# Patient Record
Sex: Male | Born: 1985 | Race: White | Hispanic: No | Marital: Married | Smoking: Never smoker
Health system: Southern US, Community
[De-identification: ages and names within clinical notes are randomized; demographics above are authoritative.]

## PROBLEM LIST (undated history)

## (undated) DIAGNOSIS — R519 Headache, unspecified: Secondary | ICD-10-CM

## (undated) DIAGNOSIS — J45909 Unspecified asthma, uncomplicated: Secondary | ICD-10-CM

## (undated) HISTORY — PX: WISDOM TOOTH EXTRACTION: SHX21

## (undated) HISTORY — PX: IMAGE GUIDED SINUS SURGERY: SHX6570

---

## 2007-09-30 ENCOUNTER — Ambulatory Visit: Payer: Self-pay | Admitting: Internal Medicine

## 2017-01-14 ENCOUNTER — Encounter: Payer: Self-pay | Admitting: *Deleted

## 2017-01-14 ENCOUNTER — Ambulatory Visit
Admission: EM | Admit: 2017-01-14 | Discharge: 2017-01-14 | Disposition: A | Discharge status: Home / Self Care | Payer: BLUE CROSS/BLUE SHIELD | Attending: Family Medicine | Admitting: Family Medicine

## 2017-01-14 DIAGNOSIS — J020 Streptococcal pharyngitis: Secondary | ICD-10-CM

## 2017-01-14 HISTORY — DX: Unspecified asthma, uncomplicated: J45.909

## 2017-01-14 LAB — RAPID STREP SCREEN (MED CTR MEBANE ONLY): STREPTOCOCCUS, GROUP A SCREEN (DIRECT): POSITIVE — AB

## 2017-01-14 MED ORDER — PENICILLIN G BENZATHINE 1200000 UNIT/2ML IM SUSP
1.2000 10*6.[IU] | Freq: Once | INTRAMUSCULAR | Status: AC
  Administered 2017-01-14: 1.2 10*6.[IU] via INTRAMUSCULAR

## 2017-01-14 MED ORDER — DEXAMETHASONE SODIUM PHOSPHATE 10 MG/ML IJ SOLN
10.0000 mg | Freq: Once | INTRAMUSCULAR | Status: AC
  Administered 2017-01-14: 10 mg via INTRAMUSCULAR

## 2017-01-14 NOTE — ED Provider Notes (Signed)
[facility] ____________________________________________  Time seen: Approximately 7:30 PM  I have reviewed the triage vital signs and the nursing notes.   HISTORY  Chief Complaint Oral Swelling   HPI Neil Serrano is a 31 y.o. male  presenting with wife at bedside for evaluation of sore throat since yesterday. Patient reports this time sore throat is mild. states that most sore throat is moderate. States sore throat was worse this morning when he first got up. States has taken some over-the-counter Tylenol or ibuprofen with some improvement. States has not take any medication last few hours. Reports he has some runny nose and nasal congestion that is consistent with his chronic seasonal allergies. Denies cough. Reports continues to eat and drink well. Denies any difficulty swallowing or breathing. Denies known sick contacts. Denies recent antibiotic use. Denies chest pain, shortness of breath, abdominal pain, dysuria or rash. Patient states he is hungry now. Reports has continued to remain active. Denies aggravating or alleviating factors. Denies known fevers.    Past Medical History:  Diagnosis Date  . Asthma     There are no active problems to display for this patient.   Past Surgical History:  Procedure Laterality Date  . WISDOM TOOTH EXTRACTION       No current facility-administered medications for this encounter.  No current outpatient prescriptions on file.  Allergies Patient has no known allergies.  History reviewed. No pertinent family history.  Social History Social History  Substance Use Topics  . Smoking status: Never Smoker  . Smokeless tobacco: Never Used  . Alcohol use No    Review of Systems Constitutional: No fever/chills Eyes: No visual changes. ENT: Positive sore throat. Cardiovascular: Denies chest pain. Respiratory: Denies shortness of breath. Musculoskeletal: Negative for back pain. Skin: Negative for  rash.  ____________________________________________   PHYSICAL EXAM:  VITAL SIGNS: ED Triage Vitals  Enc Vitals Group     BP 01/14/17 1835 136/72     Pulse Rate 01/14/17 1835 70     Resp 01/14/17 1835 16     Temp 01/14/17 1835 98.6 F (37 C)     Temp Source 01/14/17 1835 Oral     SpO2 01/14/17 1835 99 %     Weight 01/14/17 1837 240 lb (108.9 kg)     Height 01/14/17 1837 5\' 10"  (1.778 m)     Head Circumference --      Peak Flow --      Pain Score 01/14/17 1838 0     Pain Loc --      Pain Edu? --      Excl. in [place]? --     Constitutional: Alert and oriented. Well appearing and in no acute distress. Eyes: Conjunctivae are normal. PERRL. EOMI. Head: Atraumatic. No sinus tenderness to palpation. No swelling. No erythema.  Ears: no erythema, normal TMs bilaterally.   Nose: Nasal congestion with clear rhinorrhea.   Mouth/Throat: Mucous membranes are moist. Moderate pharyngeal erythema. Mild bilateral tonsillar swelling and uvular swelling. No exudate. No uvular shift or deviation. Neck: No stridor.  No cervical spine tenderness to palpation. Hematological/Lymphatic/Immunilogical: Mild anterior bilateral cervical lymphadenopathy. Cardiovascular: Normal rate, regular rhythm. Grossly normal heart sounds.  Good peripheral circulation. Respiratory: Normal respiratory effort.  No retractions. No wheezes, rales or rhonchi. Good air movement.  Gastrointestinal: Soft and nontender. Musculoskeletal: Ambulatory with steady gait. No cervical, thoracic or lumbar tenderness to palpation. Neurologic:  Normal speech and language. No gait instability. Skin:  Skin appears warm, dry  Psychiatric: Mood  and affect are normal. Speech and behavior are normal. ___________________________________________   LABS (all labs ordered are listed, but only abnormal results are displayed)  Labs Reviewed  RAPID STREP SCREEN (NOT AT [facility]) - Abnormal; Notable for the following:       Result Value    Streptococcus, Group A Screen (Direct) POSITIVE (*)    All other components within normal limits   ____________________________________________  RADIOLOGY  No results found. ____________________________________________   PROCEDURES Procedures     INITIAL IMPRESSION / ASSESSMENT AND PLAN / ED COURSE  Pertinent labs & imaging results that were available during my care of the patient were reviewed by me and considered in my medical decision making (see chart for details).  Very well-appearing patient. No acute distress. Sore throat 2 days. Quick strep positive. Discussed with patient treatment options. Patient requests to proceed with Bicillin IM once in urgent care. 1.2 million units Bicillin given. 10 mg IM Decadron also given. Encouraged rest, fluids and supportive care.Discussed indication, risks and benefits of medications with patient.  Discussed follow up with Primary care physician this week. Discussed follow up and return parameters including no resolution or any worsening concerns. Patient verbalized understanding and agreed to plan.   ____________________________________________   FINAL CLINICAL IMPRESSION(S) / ED DIAGNOSES  Final diagnoses:  Strep pharyngitis     There are no discharge medications for this patient.   Note: This dictation was prepared with Dragon dictation along with smaller phrase technology. Any transcriptional errors that result from this process are unintentional.         Marylene Land, NP 01/14/17 2055

## 2017-01-14 NOTE — ED Triage Notes (Signed)
Patient started having symptom of swollen throat yesterday.

## 2017-01-14 NOTE — Discharge Instructions (Signed)
Rest. Drink plenty of fluids.  ° °Follow up with your primary care physician this week as needed. Return to Urgent care for new or worsening concerns.  ° °

## 2017-09-05 ENCOUNTER — Ambulatory Visit
Admission: EM | Admit: 2017-09-05 | Discharge: 2017-09-05 | Disposition: A | Discharge status: Home / Self Care | Payer: BLUE CROSS/BLUE SHIELD | Attending: Emergency Medicine | Admitting: Emergency Medicine

## 2017-09-05 ENCOUNTER — Other Ambulatory Visit: Payer: Self-pay

## 2017-09-05 DIAGNOSIS — J029 Acute pharyngitis, unspecified: Secondary | ICD-10-CM

## 2017-09-05 DIAGNOSIS — K122 Cellulitis and abscess of mouth: Secondary | ICD-10-CM | POA: Diagnosis not present

## 2017-09-05 LAB — RAPID STREP SCREEN (MED CTR MEBANE ONLY): Streptococcus, Group A Screen (Direct): NEGATIVE

## 2017-09-05 MED ORDER — DEXAMETHASONE SODIUM PHOSPHATE 10 MG/ML IJ SOLN
10.0000 mg | Freq: Once | INTRAMUSCULAR | Status: AC
  Administered 2017-09-05: 10 mg via INTRAMUSCULAR

## 2017-09-05 MED ORDER — IBUPROFEN 600 MG PO TABS: 600.0000 mg | ORAL_TABLET | Freq: Four times a day (QID) | ORAL | 0 refills | Status: DC | PRN

## 2017-09-05 MED ORDER — FLUTICASONE PROPIONATE 50 MCG/ACT NA SUSP: 2.0000 | Freq: Every day | NASAL | 0 refills | Status: DC

## 2017-09-05 MED ORDER — AMOXICILLIN-POT CLAVULANATE 875-125 MG PO TABS: 1.0000 | ORAL_TABLET | Freq: Two times a day (BID) | ORAL | 0 refills | Status: DC

## 2017-09-05 NOTE — ED Provider Notes (Signed)
HPI  SUBJECTIVE:  Neil Serrano is a 32 y.o. male who presents with a sore throat described as "razor blades", swollen uvula and tonsils, greenish nasal congestion, cough productive of the same material as his nasal congestion, starting yesterday.  Reports sinus pain and pressure starting this morning.  He also has postnasal drip, rhinorrhea.  States that he felt feverish with chills but has had no documented fevers.  He reports a sinus headache and a raspy voice.  He states this is similar to previous strep.  He tried Tylenol, last dose was within 6-8 hours, he has tried OTC cough medicine, Mucinex, Claritin.  No alleviating factors.  Symptoms are worse with coughing.  No upper dental pain.  Reports occasional wheezing that resolves with coughing.  No chest pain, shortness of breath, dyspnea on exertion.  No body aches, rash, abdominal pain.  No sensation of throat swelling shut, difficulty breathing, drooling, trismus.  No allergy symptoms.  No contacts with strep.  He has a past medical history of seasonal allergies, strep, asthma since childhood and sinus infections.  No history of mono, diabetes, hypertension.  He is not a smoker.  PMD: None.  Past Medical History:  Diagnosis Date  . Asthma     Past Surgical History:  Procedure Laterality Date  . WISDOM TOOTH EXTRACTION      Family History  Problem Relation Age of Onset  . Healthy Mother   . Heart disease Father     Social History   Tobacco Use  . Smoking status: Never Smoker  . Smokeless tobacco: Never Used  Substance Use Topics  . Alcohol use: No  . Drug use: No    No current facility-administered medications for this encounter.   Current Outpatient Medications:  .  amoxicillin-clavulanate (AUGMENTIN) 875-125 MG tablet, Take 1 tablet by mouth 2 (two) times daily., Disp: 20 tablet, Rfl: 0 .  fluticasone (FLONASE) 50 MCG/ACT nasal spray, Place 2 sprays into both nostrils daily., Disp: 16 g, Rfl: 0 .  ibuprofen  (ADVIL,MOTRIN) 600 MG tablet, Take 1 tablet (600 mg total) by mouth every 6 (six) hours as needed., Disp: 30 tablet, Rfl: 0  No Known Allergies   ROS  As noted in HPI.   Physical Exam  BP (!) 137/93 (BP Location: Left Arm)   Pulse (!) 103   Temp 99.4 F (37.4 C) (Oral)   Resp 20   Ht 5\' 10"  (1.778 m)   Wt 245 lb (111.1 kg)   SpO2 97%   BMI 35.15 kg/m   Constitutional: Well developed, well nourished, no acute distress Eyes:  EOMI, conjunctiva normal bilaterally HENT: Normocephalic, atraumatic,mucus membranes moist.  Positive nasal congestion.  Normal turbinates.  No frontal sinus tenderness.  Mild maxillary sinus tenderness.  Uvula swollen, erythematous. Red, enlarged tonsils without exudates.  No drooling, stridor Neck: Positive cervical lymphadenopathy Respiratory: Normal inspiratory effort, lungs clear bilaterally Cardiovascular: Mild regular tachycardia no murmurs rubs or gallops GI: nondistended no splenomegaly skin: No rash, skin intact Musculoskeletal: no deformities Neurologic: Alert & oriented x 3, no focal neuro deficits Psychiatric: Speech and behavior appropriate   ED Course   Medications  dexamethasone (DECADRON) injection 10 mg (10 mg Intramuscular Given 09/05/17 0835)    Orders Placed This Encounter  Procedures  . Rapid strep screen    Standing Status:   Standing    Number of Occurrences:   1  . Culture, group A strep    Standing Status:   Standing    Number  of Occurrences:   1    Results for orders placed or performed during the hospital encounter of 09/05/17 (from the past 24 hour(s))  Rapid strep screen     Status: None   Collection Time: 09/05/17  8:09 AM  Result Value Ref Range   Streptococcus, Group A Screen (Direct) NEGATIVE NEGATIVE   No results found.  ED Clinical Impression  Pharyngitis, unspecified etiology  Uvulitis   ED Assessment/Plan  Rapid strep negative.  Sending this off for culture to confirm absence of strep throat.   10 mg of dexamethasone IM for the tonsillar and uvular swelling.  However concern for uvulitis, so will send home with Augmentin in addition to Mucinex D, Flonase, saline nasal irrigation.  Will provide primary care list and a primary care referral for routine care.  No evidence of epiglottitis.  Discussed labs, MDM, plan and followup with patient. Discussed sn/sx that should prompt return to the ED. patient agrees with plan.   Meds ordered this encounter  Medications  . dexamethasone (DECADRON) injection 10 mg  . amoxicillin-clavulanate (AUGMENTIN) 875-125 MG tablet    Sig: Take 1 tablet by mouth 2 (two) times daily.    Dispense:  20 tablet    Refill:  0  . fluticasone (FLONASE) 50 MCG/ACT nasal spray    Sig: Place 2 sprays into both nostrils daily.    Dispense:  16 g    Refill:  0  . ibuprofen (ADVIL,MOTRIN) 600 MG tablet    Sig: Take 1 tablet (600 mg total) by mouth every 6 (six) hours as needed.    Dispense:  30 tablet    Refill:  0    *This clinic note was created using [employer]. Therefore, there may be occasional mistakes despite careful proofreading.   ?   Melynda Ripple, MD 09/05/17 [phone]

## 2017-09-05 NOTE — ED Triage Notes (Signed)
Pt with sore throat that feels like razor blades, subjective fever, and feels like the last time he had strep.

## 2017-09-05 NOTE — Discharge Instructions (Signed)
Mucinex D, saline nasal irrigation with a Milta Deiters med sinus rinse, Flonase for the nasal congestion.  I am sending you home with Augmentin to cover the uvular swelling and that will also cover a sinus infection.  600 mg of ibuprofen with 1 g of Tylenol 3-4 times a day.  Follow-up with a primary care physician of your choice  Here is a list of primary care providers who are taking new patients:  Dr. Otilio Miu, Dr. Adline Potter [address] [place] [address] [phone]  [facility] [place] [address]  [phone]  [facility] [address] [place], [place] [address] [phone]  [facility]  [phone] [place], [place] [address]  Here are clinics/ other resources who will see you if you do not have insurance. Some have certain criteria that you must meet. Call them and find out what they are:  [facility]: [address] [place], [place] [address] Phone: [phone] Hours: First and Third Saturdays of each Month, 9 a.m. - 1 p.m.  [facility]: [address] [place], [place], [place] [address] Phone: [phone] Hours: Tuesday, 4 p.m. - 8 p.m. Thursday, 1 p.m. - 8 p.m. Wednesday, 9 a.m. - [facility] [address], [place], [place] [address] Phone: [phone] Pharmacy Phone Number: [phone] Dental Phone Number: [phone] [place] Help: [phone]  Dental Hours: Monday - Thursday, 8 a.m. - 6 p.m.  [facility] [address], [place], [place] [address] Phone: [phone] Pharmacy Phone Number: [phone] [facility] Insurance Help: [phone]  [facility] [address] [place], [place] [address] Phone: [phone] Pharmacy Phone Number: [phone] [facility] Insurance Help: [phone]  [facility] [address] [place], [place] [address] Phone:  [phone] [facility] Insurance Help: [phone]   [address] [place],  [address] Phone: [phone]  Go to www.goodrx.com to look up your medications. This will give you a list of where you can find your prescriptions at the most affordable prices. Or ask the pharmacist what the cash price is, or if they have any other discount programs available to help make your medication more affordable. This can be less expensive than what you would pay with insurance.

## 2017-09-08 LAB — CULTURE, GROUP A STREP (THRC)

## 2020-10-03 DIAGNOSIS — J342 Deviated nasal septum: Secondary | ICD-10-CM | POA: Diagnosis not present

## 2020-10-03 DIAGNOSIS — D487 Neoplasm of uncertain behavior of other specified sites: Secondary | ICD-10-CM | POA: Diagnosis not present

## 2020-10-04 ENCOUNTER — Other Ambulatory Visit: Payer: Self-pay | Admitting: Unknown Physician Specialty

## 2020-10-04 ENCOUNTER — Other Ambulatory Visit (HOSPITAL_COMMUNITY): Payer: Self-pay | Admitting: Unknown Physician Specialty

## 2020-10-04 DIAGNOSIS — D487 Neoplasm of uncertain behavior of other specified sites: Secondary | ICD-10-CM

## 2020-10-12 ENCOUNTER — Ambulatory Visit: Payer: BLUE CROSS/BLUE SHIELD

## 2020-10-17 ENCOUNTER — Ambulatory Visit
Admission: RE | Admit: 2020-10-17 | Discharge: 2020-10-17 | Disposition: A | Discharge status: Home / Self Care | Payer: BC Managed Care – PPO | Source: Ambulatory Visit | Attending: Unknown Physician Specialty | Admitting: Unknown Physician Specialty

## 2020-10-17 ENCOUNTER — Other Ambulatory Visit: Payer: Self-pay

## 2020-10-17 DIAGNOSIS — D487 Neoplasm of uncertain behavior of other specified sites: Secondary | ICD-10-CM | POA: Diagnosis not present

## 2020-10-17 DIAGNOSIS — J341 Cyst and mucocele of nose and nasal sinus: Secondary | ICD-10-CM | POA: Diagnosis not present

## 2020-10-17 DIAGNOSIS — J3489 Other specified disorders of nose and nasal sinuses: Secondary | ICD-10-CM | POA: Diagnosis not present

## 2020-10-17 DIAGNOSIS — R220 Localized swelling, mass and lump, head: Secondary | ICD-10-CM | POA: Diagnosis not present

## 2020-10-17 DIAGNOSIS — J320 Chronic maxillary sinusitis: Secondary | ICD-10-CM | POA: Diagnosis not present

## 2020-10-17 IMAGING — CT CT MAXILLOFACIAL W/ CM
3 series · 15 of 47 positions shown, 18 images · IV contrast (omnipaque)
Comparison: None.

CLINICAL DATA: Patient has left-sided cheek mass/lump he has
noticed for 6 months.

EXAM:
CT MAXILLOFACIAL WITH CONTRAST
TECHNIQUE: Multidetector CT imaging of the maxillofacial structures was
performed with intravenous contrast. Multiplanar CT image
reconstructions were also generated.
CONTRAST:  75mL OMNIPAQUE IOHEXOL 300 MG/ML  SOLN

[Series 2: standard · axial · 0.41mm/px · z∈[-200,-36]mm · 9 of 191 slices shown, 12 images]
[im 14/191  brain]
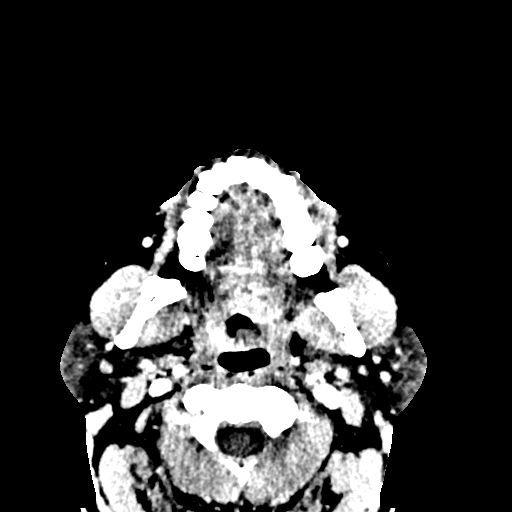
[im 14/191  bone]
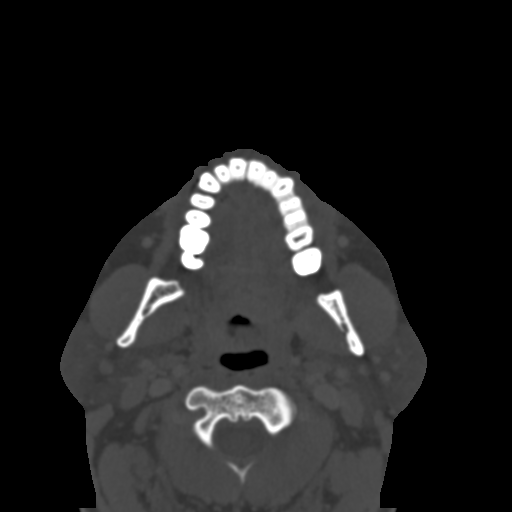
[im 33/191  bone]
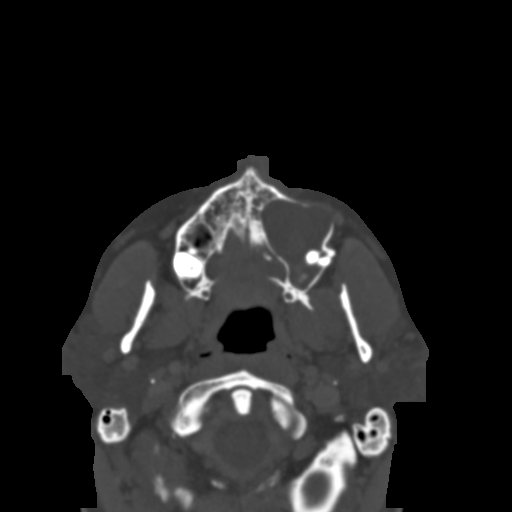
[im 53/191  bone]
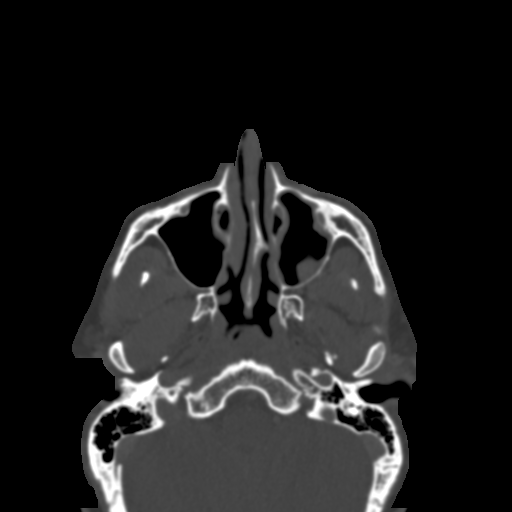
[im 73/191  bone]
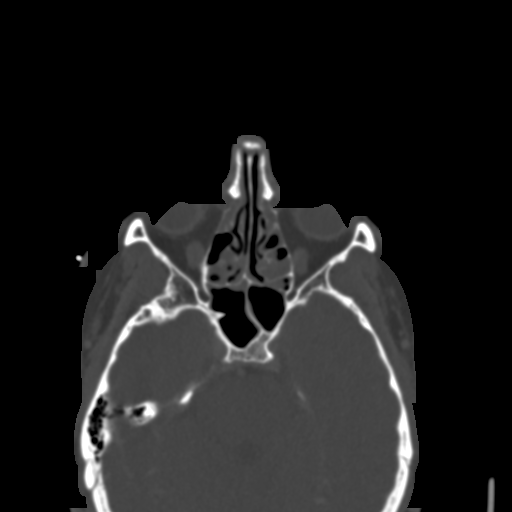
[im 99/191  brain]
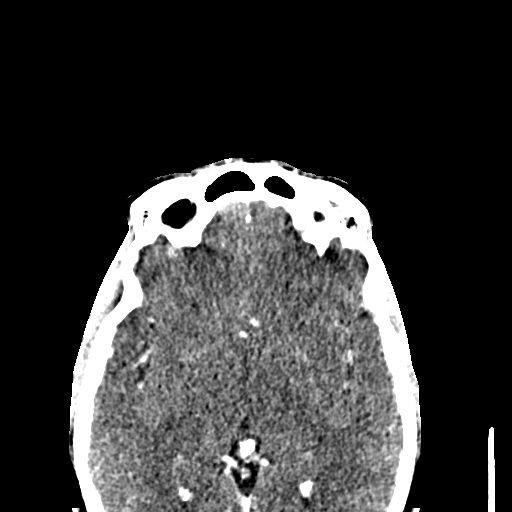
[im 99/191  bone]
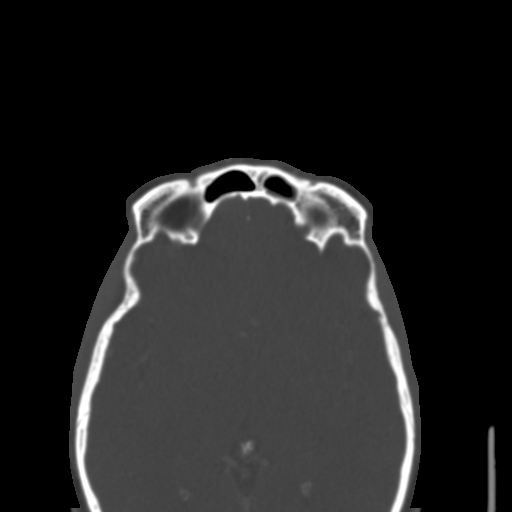
[im 118/191  bone]
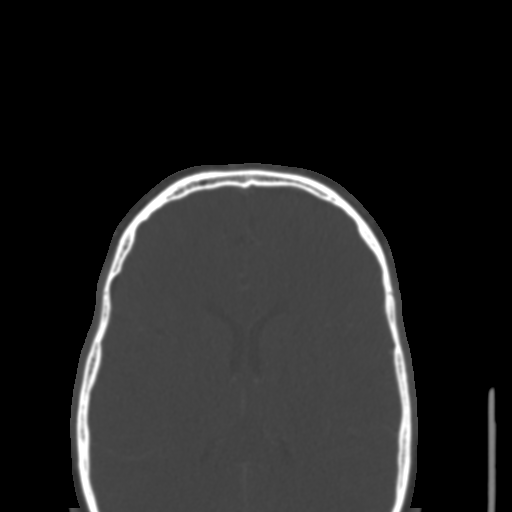
[im 138/191  bone]
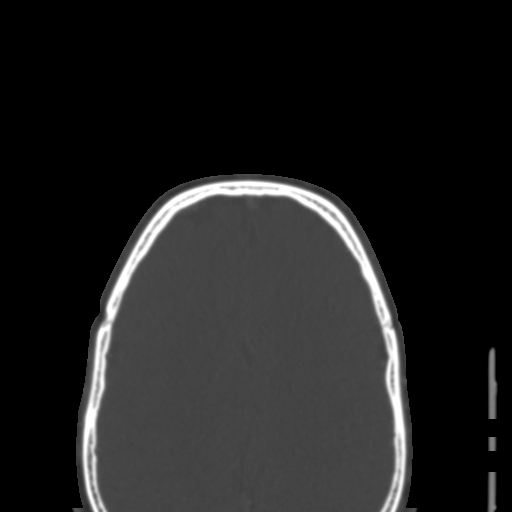
[im 158/191  bone]
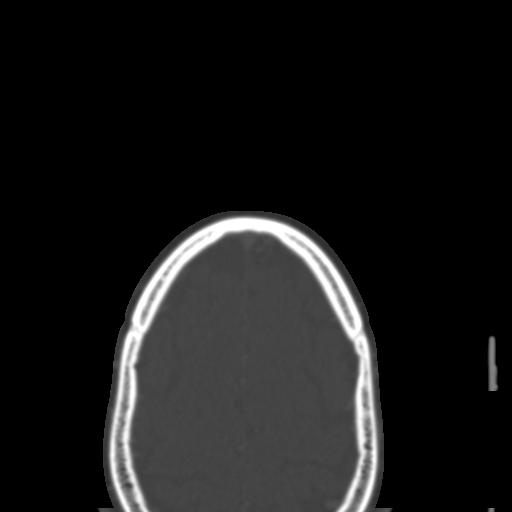
[im 177/191  brain]
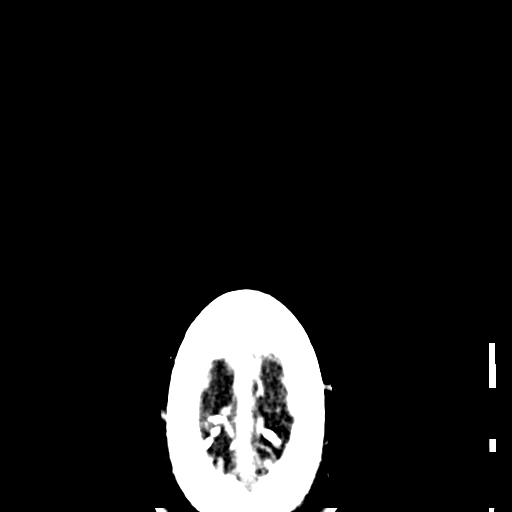
[im 177/191  bone]
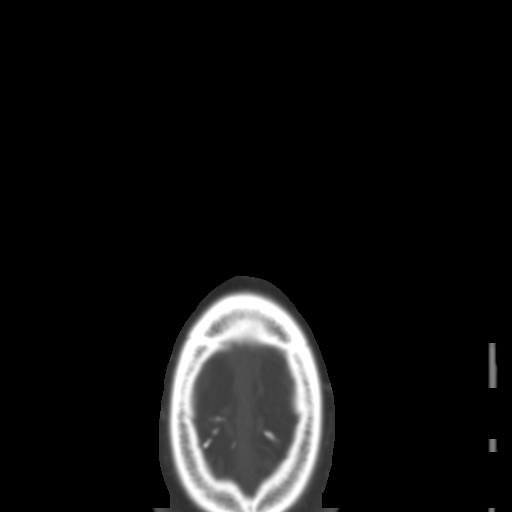

[Series 4: coronal · coronal · 0.37mm/px · 3 of 93 slices shown]
[im 31/93  bone]
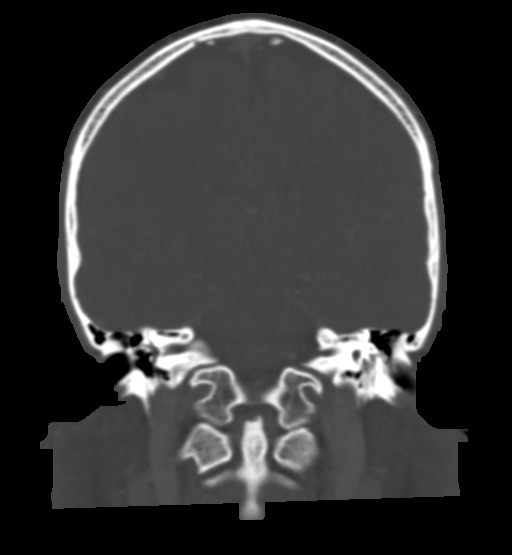
[im 41/93  bone]
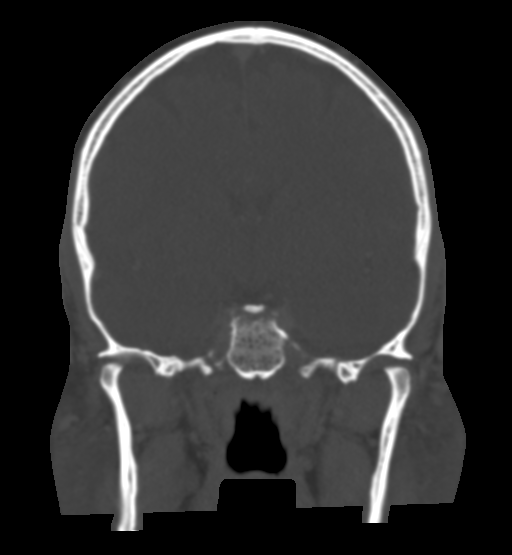
[im 52/93  bone]
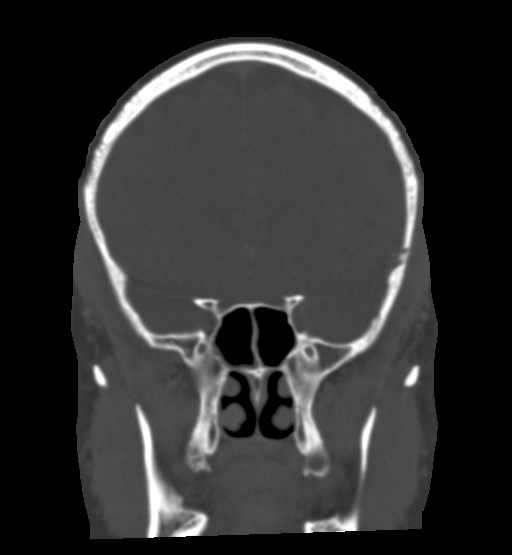

[Series 5: sagittal · sagittal · 0.36mm/px · 3 of 88 slices shown]
[im 30/88  bone]
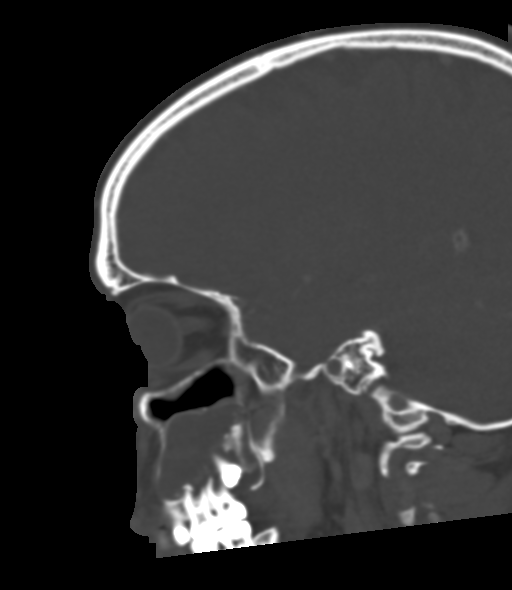
[im 44/88  bone]
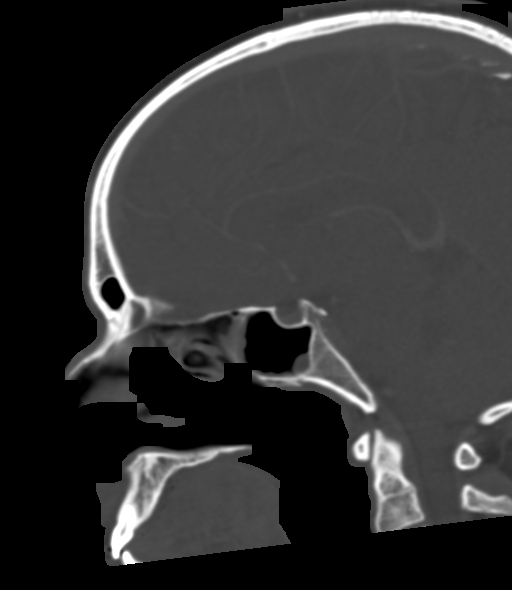
[im 59/88  bone]
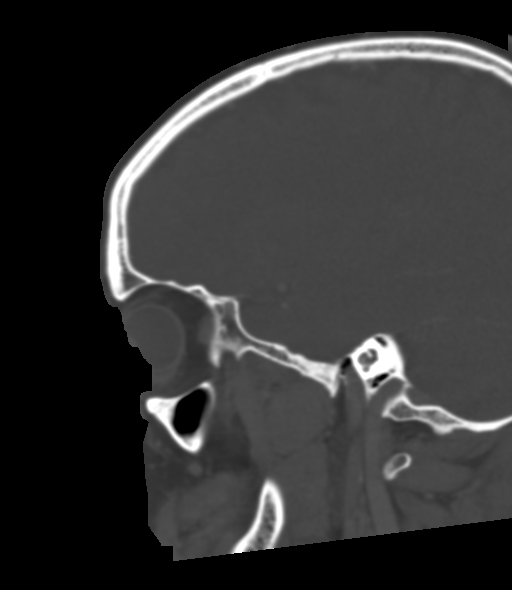

[15 of 47 positions shown; findings below may reference images not displayed]

FINDINGS: There is a large heterogeneous, predominantly cystic lesion arising
from the inferior left maxillary sinus in the region of an unerupted
left third molar located within the posterior/inferior maxillary
sinus. This lesion measures up to approximately 3.4 x 3.6 by 3.4 cm
(AP by transverse by craniocaudal) and has thin sclerotic margins.
The lesion is expansile and anteriorly there is
bowing/demineralization of the posterior and anterior maxillary
sinus walls without surrounding fat stranding. There are areas of
calcification, predominantly surrounding the crown of the unerupted
third molar.

Additional erupted third molar on the right side.

Mucosal thickening the left frontal sinus, ethmoid air cells,
sphenoid sinus and inferior left greater than right maxillary
sinuses.

No evidence of acute fracture or mandibular dislocation. Soft
tissues of the face are unremarkable.

Unremarkable orbits.

Limited intracranial evaluation without evidence of overt acute
abnormality.
IMPRESSION: 1. Large complex cystic lesion lesion in the left maxillary sinus
which is associated with an unerupted left third molar located in
the inferior/posterior left maxillary sinus, as detailed above. This
lesion is expansile with bowing/demineralization of the posterior
and anterior maxillary sinus walls, likely explaining the patient's
area of palpable concern. Findings are most consistent with a
dentigerous cyst. Differential considerations include other
odontogenic cystic lesions, including odontogenic keratocystic
odontic tumor.
2. Pansinus mucosal thickening. Correlate for signs/symptoms of
sinusitis/infection.

These results will be called to the ordering clinician or
representative by the Radiologist Assistant, and communication
documented in the PACS or [REDACTED].

## 2020-10-17 MED ORDER — IOHEXOL 300 MG/ML  SOLN
75.0000 mL | Freq: Once | INTRAMUSCULAR | Status: AC | PRN
  Administered 2020-10-17: 75 mL via INTRAVENOUS

## 2020-10-22 DIAGNOSIS — J342 Deviated nasal septum: Secondary | ICD-10-CM | POA: Diagnosis not present

## 2020-10-22 DIAGNOSIS — D487 Neoplasm of uncertain behavior of other specified sites: Secondary | ICD-10-CM | POA: Diagnosis not present

## 2020-11-29 DIAGNOSIS — D164 Benign neoplasm of bones of skull and face: Secondary | ICD-10-CM | POA: Diagnosis not present

## 2020-12-12 DIAGNOSIS — D164 Benign neoplasm of bones of skull and face: Secondary | ICD-10-CM | POA: Diagnosis not present

## 2021-01-07 DIAGNOSIS — K011 Impacted teeth: Secondary | ICD-10-CM | POA: Diagnosis not present

## 2021-01-07 DIAGNOSIS — M2740 Unspecified cyst of jaw: Secondary | ICD-10-CM | POA: Diagnosis not present

## 2021-01-07 DIAGNOSIS — Z79899 Other long term (current) drug therapy: Secondary | ICD-10-CM | POA: Diagnosis not present

## 2021-01-07 DIAGNOSIS — D164 Benign neoplasm of bones of skull and face: Secondary | ICD-10-CM | POA: Diagnosis not present

## 2021-01-07 DIAGNOSIS — J309 Allergic rhinitis, unspecified: Secondary | ICD-10-CM | POA: Diagnosis not present

## 2021-01-07 DIAGNOSIS — M810 Age-related osteoporosis without current pathological fracture: Secondary | ICD-10-CM | POA: Diagnosis not present

## 2021-01-07 DIAGNOSIS — D165 Benign neoplasm of lower jaw bone: Secondary | ICD-10-CM | POA: Diagnosis not present

## 2021-01-07 DIAGNOSIS — J320 Chronic maxillary sinusitis: Secondary | ICD-10-CM | POA: Diagnosis not present

## 2021-04-22 DIAGNOSIS — L237 Allergic contact dermatitis due to plants, except food: Secondary | ICD-10-CM | POA: Diagnosis not present

## 2021-08-27 DIAGNOSIS — J0100 Acute maxillary sinusitis, unspecified: Secondary | ICD-10-CM | POA: Diagnosis not present

## 2021-09-07 DIAGNOSIS — J3489 Other specified disorders of nose and nasal sinuses: Secondary | ICD-10-CM | POA: Diagnosis not present

## 2021-12-26 DIAGNOSIS — K090 Developmental odontogenic cysts: Secondary | ICD-10-CM | POA: Diagnosis not present

## 2022-01-14 ENCOUNTER — Encounter: Payer: Self-pay | Admitting: Family Medicine

## 2022-01-14 ENCOUNTER — Ambulatory Visit: Payer: BC Managed Care – PPO | Admitting: Family Medicine

## 2022-01-14 VITALS — BP 124/76 | HR 72 | Ht 70.0 in | Wt 238.0 lb

## 2022-01-14 DIAGNOSIS — Z0000 Encounter for general adult medical examination without abnormal findings: Secondary | ICD-10-CM

## 2022-01-14 DIAGNOSIS — Z1159 Encounter for screening for other viral diseases: Secondary | ICD-10-CM

## 2022-01-14 DIAGNOSIS — Z23 Encounter for immunization: Secondary | ICD-10-CM | POA: Diagnosis not present

## 2022-01-14 DIAGNOSIS — Z114 Encounter for screening for human immunodeficiency virus [HIV]: Secondary | ICD-10-CM

## 2022-01-14 DIAGNOSIS — Z13220 Encounter for screening for lipoid disorders: Secondary | ICD-10-CM

## 2022-01-14 DIAGNOSIS — R7989 Other specified abnormal findings of blood chemistry: Secondary | ICD-10-CM

## 2022-01-15 DIAGNOSIS — Z0000 Encounter for general adult medical examination without abnormal findings: Secondary | ICD-10-CM | POA: Insufficient documentation

## 2022-01-15 NOTE — Progress Notes (Signed)
Annual Physical Exam Visit  Patient Information:  Patient ID: Neil Serrano, male DOB: 1986/06/13 Age: 36 y.o. MRN: [id number]   Subjective:   CC: Annual Physical Exam  HPI:  Neil Serrano is here for their annual physical.  I reviewed the past medical history, family history, social history, surgical history, and allergies today and changes were made as necessary.  Please see the problem list section below for additional details.  Past Medical History: Past Medical History:  Diagnosis Date   Asthma    Past Surgical History: Past Surgical History:  Procedure Laterality Date   IMAGE GUIDED SINUS SURGERY     WISDOM TOOTH EXTRACTION     Family History: Family History  Problem Relation Age of Onset   Healthy Mother    Heart disease Father    Allergies: No Known Allergies Health Maintenance: Health Maintenance  Topic Date Due   Hepatitis C Screening  Never done   COVID-19 Vaccine (1) 01/30/2022 (Originally 03/03/1986)   INFLUENZA VACCINE  02/18/2022   TETANUS/TDAP  01/15/2032   HIV Screening  Completed   HPV VACCINES  Aged Out    HM Colonoscopy     This patient has no relevant Health Maintenance data.      Medications: Current Outpatient Medications on File Prior to Visit  Medication Sig Dispense Refill   cetirizine (ZYRTEC) 10 MG chewable tablet Chew 10 mg by mouth daily.     No current facility-administered medications on file prior to visit.    Review of Systems: No headache, visual changes, nausea, vomiting, diarrhea, constipation, dizziness, abdominal pain, skin rash, fevers, chills, night sweats, swollen lymph nodes, weight loss, chest pain, body aches, joint swelling, muscle aches, shortness of breath, mood changes, visual or auditory hallucinations reported.  Objective:   Vitals:   01/14/22 1410  BP: 124/76  Pulse: 72  SpO2: 98%   Vitals:   01/14/22 1410  Weight: 238 lb (108 kg)  Height: '5\' 10"'$  (1.778 m)   Body mass index is 34.15  kg/m.  General: Well Developed, well nourished, and in no acute distress.  Neuro: Alert and oriented x3, extra-ocular muscles intact, sensation grossly intact. Cranial nerves II through XII are grossly intact, motor, sensory, and coordinative functions are intact. HEENT: Normocephalic, atraumatic, pupils equal round reactive to light, neck supple, no masses, no lymphadenopathy, thyroid nonpalpable. Oropharynx, nasopharynx, external ear canals are unremarkable. Skin: Warm and dry, no rashes noted.  Cardiac: Regular rate and rhythm, no murmurs rubs or gallops. No peripheral edema. Pulses symmetric. Respiratory: Clear to auscultation bilaterally. Not using accessory muscles, speaking in full sentences.  Abdominal: Soft, nontender, nondistended, positive bowel sounds, no masses, no organomegaly. Musculoskeletal: Shoulder, elbow, wrist, hip, knee, ankle stable, and with full range of motion.  Impression and Recommendations:   The patient was counselled, risk factors were discussed, and anticipatory guidance given.  Problem List Items Addressed This Visit       Other   Annual physical exam - Primary    Annual examination completed, risk stratification labs ordered, anticipatory guidance provided.  We will follow labs once resulted.      Relevant Orders   Apo A1 + B + Ratio   Comprehensive metabolic panel   Hepatitis C antibody   HIV Antibody (routine testing w rflx)   Lipid panel   TSH   VITAMIN D 25 Hydroxy (Vit-D Deficiency, Fractures)   CBC with Differential/Platelet   Other Visit Diagnoses     Need for Tdap  vaccination       Relevant Orders   Tdap vaccine greater than or equal to 7yo IM (Completed)   Screening for HIV (human immunodeficiency virus)       Relevant Orders   HIV Antibody (routine testing w rflx)   Need for hepatitis C screening test       Relevant Orders   Hepatitis C antibody   Screening for lipoid disorders       Relevant Orders   Apo A1 + B + Ratio    Lipid panel   Low serum vitamin D       Relevant Orders   VITAMIN D 25 Hydroxy (Vit-D Deficiency, Fractures)        Orders & Medications Medications: No orders of the defined types were placed in this encounter.  Orders Placed This Encounter  Procedures   Tdap vaccine greater than or equal to 7yo IM   Apo A1 + B + Ratio   Comprehensive metabolic panel   Hepatitis C antibody   HIV Antibody (routine testing w rflx)   Lipid panel   TSH   VITAMIN D 25 Hydroxy (Vit-D Deficiency, Fractures)   CBC with Differential/Platelet     Return in about 1 year (around 01/15/2023).    Montel Culver, MD   Primary [facility] [place]

## 2022-01-15 NOTE — Assessment & Plan Note (Signed)
Annual examination completed, risk stratification labs ordered, anticipatory guidance provided.  We will follow labs once resulted. 

## 2022-01-24 DIAGNOSIS — Z13220 Encounter for screening for lipoid disorders: Secondary | ICD-10-CM | POA: Diagnosis not present

## 2022-01-24 DIAGNOSIS — Z0000 Encounter for general adult medical examination without abnormal findings: Secondary | ICD-10-CM | POA: Diagnosis not present

## 2022-01-24 DIAGNOSIS — Z1159 Encounter for screening for other viral diseases: Secondary | ICD-10-CM | POA: Diagnosis not present

## 2022-01-24 DIAGNOSIS — Z1321 Encounter for screening for nutritional disorder: Secondary | ICD-10-CM | POA: Diagnosis not present

## 2022-01-25 LAB — COMPREHENSIVE METABOLIC PANEL
ALT: 20 IU/L (ref 0–44)
AST: 20 IU/L (ref 0–40)
Albumin/Globulin Ratio: 1.5 (ref 1.2–2.2)
Albumin: 4.5 g/dL (ref 4.0–5.0)
Alkaline Phosphatase: 59 IU/L (ref 44–121)
BUN/Creatinine Ratio: 10 (ref 9–20)
BUN: 9 mg/dL (ref 6–20)
Bilirubin Total: 0.9 mg/dL (ref 0.0–1.2)
CO2: 22 mmol/L (ref 20–29)
Calcium: 9.6 mg/dL (ref 8.7–10.2)
Chloride: 104 mmol/L (ref 96–106)
Creatinine, Ser: 0.88 mg/dL (ref 0.76–1.27)
Globulin, Total: 3 g/dL (ref 1.5–4.5)
Glucose: 86 mg/dL (ref 70–99)
Potassium: 4.4 mmol/L (ref 3.5–5.2)
Sodium: 139 mmol/L (ref 134–144)
Total Protein: 7.5 g/dL (ref 6.0–8.5)
eGFR: 116 mL/min/{1.73_m2} (ref >59)

## 2022-01-25 LAB — HEPATITIS C ANTIBODY: Hep C Virus Ab: NONREACTIVE

## 2022-01-25 LAB — APO A1 + B + RATIO
Apolipo. B/A-1 Ratio: 1 ratio — ABNORMAL HIGH (ref 0.0–0.7)
Apolipoprotein A-1: 122 mg/dL (ref 101–178)
Apolipoprotein B: 117 mg/dL — ABNORMAL HIGH (ref <90)

## 2022-01-25 LAB — CBC WITH DIFFERENTIAL/PLATELET
Basophils Absolute: 0.1 10*3/uL (ref 0.0–0.2)
Basos: 1 %
EOS (ABSOLUTE): 0.5 10*3/uL — ABNORMAL HIGH (ref 0.0–0.4)
Eos: 8 %
Hematocrit: 46.3 % (ref 37.5–51.0)
Hemoglobin: 15.6 g/dL (ref 13.0–17.7)
Immature Grans (Abs): 0 10*3/uL (ref 0.0–0.1)
Immature Granulocytes: 0 %
Lymphocytes Absolute: 2.4 10*3/uL (ref 0.7–3.1)
Lymphs: 36 %
MCH: 30.4 pg (ref 26.6–33.0)
MCHC: 33.7 g/dL (ref 31.5–35.7)
MCV: 90 fL (ref 79–97)
Monocytes Absolute: 0.6 10*3/uL (ref 0.1–0.9)
Monocytes: 9 %
Neutrophils Absolute: 3.2 10*3/uL (ref 1.4–7.0)
Neutrophils: 46 %
Platelets: 302 10*3/uL (ref 150–450)
RBC: 5.13 x10E6/uL (ref 4.14–5.80)
RDW: 12.4 % (ref 11.6–15.4)
WBC: 6.9 10*3/uL (ref 3.4–10.8)

## 2022-01-25 LAB — LIPID PANEL
Chol/HDL Ratio: 5.5 ratio — ABNORMAL HIGH (ref 0.0–5.0)
Cholesterol, Total: 199 mg/dL (ref 100–199)
HDL: 36 mg/dL — ABNORMAL LOW (ref >39)
LDL Chol Calc (NIH): 131 mg/dL — ABNORMAL HIGH (ref 0–99)
Triglycerides: 181 mg/dL — ABNORMAL HIGH (ref 0–149)
VLDL Cholesterol Cal: 32 mg/dL (ref 5–40)

## 2022-01-25 LAB — TSH: TSH: 1.41 u[IU]/mL (ref 0.450–4.500)

## 2022-01-25 LAB — VITAMIN D 25 HYDROXY (VIT D DEFICIENCY, FRACTURES): Vit D, 25-Hydroxy: 33 ng/mL (ref 30.0–100.0)

## 2022-01-25 LAB — HIV ANTIBODY (ROUTINE TESTING W REFLEX): HIV Screen 4th Generation wRfx: NONREACTIVE

## 2022-04-18 DIAGNOSIS — B353 Tinea pedis: Secondary | ICD-10-CM | POA: Diagnosis not present

## 2022-05-19 ENCOUNTER — Ambulatory Visit: Payer: Self-pay | Admitting: Internal Medicine

## 2022-05-22 DIAGNOSIS — R21 Rash and other nonspecific skin eruption: Secondary | ICD-10-CM | POA: Diagnosis not present

## 2022-06-09 ENCOUNTER — Encounter: Payer: Self-pay | Admitting: Family Medicine

## 2022-06-09 ENCOUNTER — Ambulatory Visit: Payer: BC Managed Care – PPO | Admitting: Family Medicine

## 2022-06-09 VITALS — BP 130/70 | HR 86 | Ht 70.0 in | Wt 238.0 lb

## 2022-06-09 DIAGNOSIS — R21 Rash and other nonspecific skin eruption: Secondary | ICD-10-CM

## 2022-06-09 DIAGNOSIS — L309 Dermatitis, unspecified: Secondary | ICD-10-CM | POA: Insufficient documentation

## 2022-06-09 MED ORDER — TERBINAFINE HCL 250 MG PO TABS: 250.0000 mg | ORAL_TABLET | Freq: Every day | ORAL | 0 refills | Status: DC

## 2022-06-09 NOTE — Assessment & Plan Note (Signed)
>>  ASSESSMENT AND PLAN FOR RASH OF FOOT WRITTEN ON 06/09/2022  5:18 PM BY Alexiss Iturralde J, MD  Had minor rash initially noted at physical on bilateral feet, trialed OTC regimen for athlete's foot without response, utilized telemedicine for ketoconazole and then oral fluconazole.  He has had progressive symptoms despite adherence to regimen.  Examination bilateral reveals areas of erythema, yellowing, and dry, peeling skin.  Well demarcated areas noted around the feet and bilateral axillary regions.  Differential can include tinea infection versus psoriasis, we will have the patient transition to scheduled turbine fine, trial topical OTC steroid, referral to podiatry placed.

## 2022-06-09 NOTE — Assessment & Plan Note (Addendum)
Had minor rash initially noted at physical on bilateral feet, trialed OTC regimen for athlete's foot without response, utilized telemedicine for ketoconazole and then oral fluconazole.  He has had progressive symptoms despite adherence to regimen.  Examination bilateral reveals areas of erythema, yellowing, and dry, peeling skin.  Well demarcated areas noted around the feet and bilateral axillary regions.  Differential can include tinea infection versus psoriasis, we will have the patient transition to scheduled turbine fine, trial topical OTC steroid, referral to podiatry placed.

## 2022-06-09 NOTE — Progress Notes (Signed)
     Primary Care / Sports Medicine Office Visit  Patient Information:  Patient ID: TRAYON KRANTZ, male DOB: 1986-03-27 Age: 36 y.o. MRN: [id number]   Neil Serrano is a pleasant 36 y.o. male presenting with the following:  Chief Complaint  Patient presents with   Rash    Of foot, it isn't getting any better, did telehealth back in september, was given ketoconazole with no relief. Then was given Diflucan, didn't help. Called to get Derm appointment, can't been seen until May. Also rash showing up under arm now. No change in [place] or laundry soap    Vitals:   06/09/22 0843  BP: 130/70  Pulse: 86  SpO2: 97%   Vitals:   06/09/22 0843  Weight: 238 lb (108 kg)  Height: '5\' 10"'$  (1.778 m)   Body mass index is 34.15 kg/m.  No results found.   Independent interpretation of notes and tests performed by another provider:   None  Procedures performed:   None  Pertinent History, Exam, Impression, and Recommendations:   Problem List Items Addressed This Visit       Musculoskeletal and Integument   Rash of foot - Primary    Had minor rash initially noted at physical on bilateral feet, trialed OTC regimen for athlete's foot without response, utilized telemedicine for ketoconazole and then oral fluconazole.  He has had progressive symptoms despite adherence to regimen.  Examination bilateral reveals areas of erythema, yellowing, and dry, peeling skin.  Well demarcated areas noted around the feet and bilateral axillary regions.  Differential can include tinea infection versus psoriasis, we will have the patient transition to scheduled turbine fine, trial topical OTC steroid, referral to podiatry placed.      Relevant Orders   Ambulatory referral to Podiatry   Other Visit Diagnoses     Body rash       Relevant Orders   ANA 12Plus Profile, Do All RDL        Orders & Medications Meds ordered this encounter  Medications   terbinafine (LAMISIL) 250 MG tablet    Sig:  Take 1 tablet (250 mg total) by mouth daily.    Dispense:  30 tablet    Refill:  0   Orders Placed This Encounter  Procedures   ANA 12Plus Profile, Do All RDL   Ambulatory referral to Podiatry     No follow-ups on file.     Montel Culver, MD   Primary Care Sports Medicine [place]

## 2022-06-09 NOTE — Patient Instructions (Addendum)
-   Obtain blood work - Dose terbinafine daily - See podiatry, contact number below  [place] [facility]. [place],  [place]  [address] Main: [phone]

## 2022-06-10 ENCOUNTER — Ambulatory Visit: Payer: BC Managed Care – PPO | Admitting: Podiatry

## 2022-06-10 DIAGNOSIS — B353 Tinea pedis: Secondary | ICD-10-CM | POA: Diagnosis not present

## 2022-06-10 DIAGNOSIS — L309 Dermatitis, unspecified: Secondary | ICD-10-CM | POA: Diagnosis not present

## 2022-06-10 DIAGNOSIS — R21 Rash and other nonspecific skin eruption: Secondary | ICD-10-CM

## 2022-06-10 MED ORDER — METHYLPREDNISOLONE 4 MG PO TBPK: ORAL_TABLET | ORAL | 0 refills | Status: DC

## 2022-06-10 MED ORDER — DOXYCYCLINE HYCLATE 100 MG PO TABS: 100.0000 mg | ORAL_TABLET | Freq: Two times a day (BID) | ORAL | 0 refills | Status: AC

## 2022-06-10 MED ORDER — CLOTRIMAZOLE-BETAMETHASONE 1-0.05 % EX CREA: 1.0000 | TOPICAL_CREAM | Freq: Two times a day (BID) | CUTANEOUS | 0 refills | Status: DC

## 2022-06-10 NOTE — Progress Notes (Signed)
  Subjective:  Patient ID: Neil Serrano, male    DOB: [birth year],  MRN: [id number]  Chief Complaint  Patient presents with   Rash    Bilateral red rash on top of feet     36 y.o. male presents with the above complaint. Patient presents with bilateral dorsal foot dermatitis/rash.  Patient states been present for quite some time.  Patient has seen multiple doctors in the past which tried some ketoconazole cream as well as fluconazole.  None of which has helped.  There is some itching and some burning associated with it.  Patient states he just came out of nowhere.  He has not tried anything else for it.  He would like to discuss other treatment options.   Review of Systems: Negative except as noted in the HPI. Denies N/V/F/Ch.  Past Medical History:  Diagnosis Date   Asthma     Current Outpatient Medications:    clotrimazole-betamethasone (LOTRISONE) cream, Apply 1 Application topically 2 (two) times daily., Disp: 30 g, Rfl: 0   doxycycline (VIBRA-TABS) 100 MG tablet, Take 1 tablet (100 mg total) by mouth 2 (two) times daily for 14 days., Disp: 28 tablet, Rfl: 0   methylPREDNISolone (MEDROL DOSEPAK) 4 MG TBPK tablet, Take as directed, Disp: 21 each, Rfl: 0   cetirizine (ZYRTEC) 10 MG chewable tablet, Chew 10 mg by mouth daily., Disp: , Rfl:    ketoconazole (NIZORAL) 2 % cream, Apply topically., Disp: , Rfl:    terbinafine (LAMISIL) 250 MG tablet, Take 1 tablet (250 mg total) by mouth daily., Disp: 30 tablet, Rfl: 0  Social History   Tobacco Use  Smoking Status Never  Smokeless Tobacco Never    No Known Allergies Objective:  There were no vitals filed for this visit. There is no height or weight on file to calculate BMI. Constitutional Well developed. Well nourished.  Vascular Dorsalis pedis pulses palpable bilaterally. Posterior tibial pulses palpable bilaterally. Capillary refill normal to all digits.  No cyanosis or clubbing noted. Pedal hair growth normal.  Neurologic  Normal speech. Oriented to person, place, and time. Epicritic sensation to light touch grossly present bilaterally.  Dermatologic Bilateral dorsal foot rash noted without any scaling.  Subjective component of itching associated with it.  Some epidermolysis noted.  Some petechiae is noted.  Orthopedic: Normal joint ROM without pain or crepitus bilaterally. No visible deformities. No bony tenderness.   Radiographs: None Assessment:   1. Rash of foot   2. Dermatitis   3. Tinea pedis of both feet    Plan:  Patient was evaluated and treated and all questions answered.  Bilateral athlete's foot versus atopic dermatitis less likely psoriasis -All questions and concerns were discussed with the patient in extensive detail -I believe patient will benefit from doxycycline for 14 days as well as Lamisil which she has already started taking since yesterday. -He will also benefit from Medrol Dosepak -He will also benefit from Lotrisone cream to be applied twice a day to the rash bilaterally.  I discussed with patient he states understanding. -Also discussed with the patient importance of keeping it dry.  No follow-ups on file.

## 2022-06-18 LAB — ANA 12PLUS PROFILE, DO ALL RDL
Anti-CCP Ab, IgG & IgA (RDL): <20 Units (ref <20)
Anti-Cardiolipin Ab, IgA (RDL): <12 APL U/mL (ref <12)
Anti-Cardiolipin Ab, IgG (RDL): <15 GPL U/mL (ref <15)
Anti-Cardiolipin Ab, IgM (RDL): <13 MPL U/mL (ref <13)
Anti-Centromere Ab (RDL): <1:40 {titer}
Anti-Chromatin Ab, IgG (RDL): <20 Units (ref <20)
Anti-La (SS-B) Ab (RDL): <20 Units (ref <20)
Anti-Nuclear Ab by IFA (RDL): POSITIVE — AB
Anti-Ro (SS-A) Ab (RDL): <20 Units (ref <20)
Anti-Scl-70 Ab (RDL): <20 Units (ref <20)
Anti-Sm Ab (RDL): <20 Units (ref <20)
Anti-TPO Ab (RDL): <9 IU/mL (ref <9.0)
Anti-U1 RNP Ab (RDL): <20 Units (ref <20)
Anti-dsDNA Ab by Farr(RDL): <8 IU/mL (ref <8.0)
C3 Complement (RDL): 171 mg/dL — ABNORMAL HIGH (ref 82–167)
C4 Complement (RDL): 26 mg/dL (ref 14–44)
Rheumatoid Factor by Turb RDL: <14 IU/mL (ref <14)

## 2022-06-18 LAB — ANA TITER AND PATTERN: Speckled Pattern: 1:40 {titer} — ABNORMAL HIGH

## 2022-07-08 ENCOUNTER — Encounter: Payer: Self-pay | Admitting: Podiatry

## 2022-07-08 ENCOUNTER — Ambulatory Visit: Payer: BC Managed Care – PPO | Admitting: Podiatry

## 2022-07-08 VITALS — BP 125/65 | HR 72

## 2022-07-08 DIAGNOSIS — B353 Tinea pedis: Secondary | ICD-10-CM | POA: Diagnosis not present

## 2022-07-08 DIAGNOSIS — L309 Dermatitis, unspecified: Secondary | ICD-10-CM

## 2022-07-08 DIAGNOSIS — R21 Rash and other nonspecific skin eruption: Secondary | ICD-10-CM | POA: Diagnosis not present

## 2022-07-08 MED ORDER — CLOTRIMAZOLE-BETAMETHASONE 1-0.05 % EX CREA: 1.0000 | TOPICAL_CREAM | Freq: Every day | CUTANEOUS | 0 refills | Status: DC

## 2022-07-15 NOTE — Progress Notes (Signed)
  Subjective:  Patient ID: Neil Serrano, male    DOB: 1985/11/16,  MRN: [id number]  Chief Complaint  Patient presents with   Rash    Patient is here for follow-up bilateral rash, he states that the rash is much better.   Follow-up    36 y.o. male presents with the above complaint.  Patient presents for follow-up of bilateral dorsal foot dermatitis/rash.  He states he is doing a lot better the medications orally helped considerably topical medication helped.  Denies any other acute complaints   Review of Systems: Negative except as noted in the HPI. Denies N/V/F/Ch.  Past Medical History:  Diagnosis Date   Asthma     Current Outpatient Medications:    cetirizine (ZYRTEC) 10 MG chewable tablet, Chew 10 mg by mouth daily., Disp: , Rfl:    clotrimazole-betamethasone (LOTRISONE) cream, Apply 1 Application topically 2 (two) times daily., Disp: 30 g, Rfl: 0   clotrimazole-betamethasone (LOTRISONE) cream, Apply 1 Application topically daily., Disp: 30 g, Rfl: 0   ketoconazole (NIZORAL) 2 % cream, Apply topically., Disp: , Rfl:    methylPREDNISolone (MEDROL DOSEPAK) 4 MG TBPK tablet, Take as directed, Disp: 21 each, Rfl: 0   terbinafine (LAMISIL) 250 MG tablet, Take 1 tablet (250 mg total) by mouth daily., Disp: 30 tablet, Rfl: 0  Social History   Tobacco Use  Smoking Status Never  Smokeless Tobacco Never    No Known Allergies Objective:   Vitals:   07/08/22 1006  BP: 125/65  Pulse: 72   There is no height or weight on file to calculate BMI. Constitutional Well developed. Well nourished.  Vascular Dorsalis pedis pulses palpable bilaterally. Posterior tibial pulses palpable bilaterally. Capillary refill normal to all digits.  No cyanosis or clubbing noted. Pedal hair growth normal.  Neurologic Normal speech. Oriented to person, place, and time. Epicritic sensation to light touch grossly present bilaterally.  Dermatologic No further bilateral dorsal foot rash noted  without any scaling.  No further subjective component of itching associated with it.  No further epidermolysis noted.  No further petechiae is noted.  Orthopedic: Normal joint ROM without pain or crepitus bilaterally. No visible deformities. No bony tenderness.   Radiographs: None Assessment:   No diagnosis found.  Plan:  Patient was evaluated and treated and all questions answered.  Bilateral athlete's foot versus atopic dermatitis less likely psoriasis -All questions and concerns were discussed with the patient in extensive detail -Clinically healed with Lotrisone cream.  I encouraged him to continue applying Lotrisone cream topically at this point.  No further rash noted.  The oral medication helped considerably.  No follow-ups on file.

## 2022-07-16 ENCOUNTER — Encounter: Payer: Self-pay | Admitting: Family Medicine

## 2022-07-17 ENCOUNTER — Other Ambulatory Visit: Payer: Self-pay

## 2022-07-17 DIAGNOSIS — R768 Other specified abnormal immunological findings in serum: Secondary | ICD-10-CM

## 2022-07-17 DIAGNOSIS — L309 Dermatitis, unspecified: Secondary | ICD-10-CM

## 2022-07-17 NOTE — Telephone Encounter (Signed)
Referral placed.

## 2022-07-17 NOTE — Telephone Encounter (Signed)
Referral

## 2022-08-18 DIAGNOSIS — R768 Other specified abnormal immunological findings in serum: Secondary | ICD-10-CM | POA: Diagnosis not present

## 2022-08-18 DIAGNOSIS — R21 Rash and other nonspecific skin eruption: Secondary | ICD-10-CM | POA: Diagnosis not present

## 2022-09-23 DIAGNOSIS — R0981 Nasal congestion: Secondary | ICD-10-CM | POA: Diagnosis not present

## 2022-09-23 DIAGNOSIS — J342 Deviated nasal septum: Secondary | ICD-10-CM | POA: Diagnosis not present

## 2022-09-24 ENCOUNTER — Other Ambulatory Visit: Payer: Self-pay

## 2022-09-24 ENCOUNTER — Encounter: Payer: Self-pay | Admitting: Unknown Physician Specialty

## 2022-09-29 NOTE — Discharge Instructions (Signed)
[facility] ENDOSCOPIC SINUS SURGERY [facility]  What is Functional Endoscopic Sinus Surgery?  The Surgery involves making the natural openings of the sinuses larger by removing the bony partitions that separate the sinuses from the nasal cavity.  The natural sinus lining is preserved as much as possible to allow the sinuses to resume normal function after the surgery.  In some patients nasal polyps (excessively swollen lining of the sinuses) may be removed to relieve obstruction of the sinus openings.  The surgery is performed through the nose using lighted scopes, which eliminates the need for incisions on the face.  A septoplasty is a different procedure which is sometimes performed with sinus surgery.  It involves straightening the boy partition that separates the two sides of your nose.  A crooked or deviated septum may need repair if is obstructing the sinuses or nasal airflow.  Turbinate reduction is also often performed during sinus surgery.  The turbinates are bony proturberances from the side walls of the nose which swell and can obstruct the nose in patients with sinus and allergy problems.  Their size can be surgically reduced to help relieve nasal obstruction.  What Can Sinus Surgery Do For Me?  Sinus surgery can reduce the frequency of sinus infections requiring antibiotic treatment.  This can provide improvement in nasal congestion, post-nasal drainage, facial pressure and nasal obstruction.  Surgery will NOT prevent you from ever having an infection again, so it usually only for patients who get infections 4 or more times yearly requiring antibiotics, or for infections that do not clear with antibiotics.  It will not cure nasal allergies, so patients with allergies may still require medication to treat their allergies after surgery. Surgery may improve headaches related to sinusitis, however, some people will continue to  require medication to control sinus headaches related to allergies.  Surgery will do nothing for other forms of headache (migraine, tension or cluster).  What Are the Risks of Endoscopic Sinus Surgery?  Current techniques allow surgery to be performed safely with little risk, however, there are rare complications that patients should be aware of.  Because the sinuses are located around the eyes, there is risk of eye injury, including blindness, though again, this would be quite rare. This is usually a result of bleeding behind the eye during surgery, which can effect vision, though there are treatments to protect the vision and prevent permanent injury. More serious complications would include bleeding inside the brain cavity or damage to the brain.This happens when the fluid around the brain leaks out into the sinus cavity.  Again, all of these complications are uncommon, and spinal fluid leaks can be safely managed surgically if they occur.  The most common complication of sinus surgery is bleeding from the nose, which may require packing or cauterization of the nose.  Patients with polyps may experience recurrence of the polyps that would require revision surgery.  Alterations of sense of smell or injury to the tear ducts are also rare complications.   What is the Surgery Like, and what is the Recovery?  The Surgery usually takes a couple of hours to perform, and is usually performed under a general anesthetic (completely asleep).  Patients are usually discharged home after a couple of hours.  Sometimes during surgery it is necessary to pack the nose to control bleeding, and the packing is left in place for 24 - 48 hours, and removed by your surgeon.  If   a septoplasty was performed during the procedure, there is often a splint placed which must be removed after 5-7 days.   Discomfort: Pain is usually mild to moderate, and can be controlled by prescription pain medication or acetaminophen (Tylenol).   Aspirin, Ibuprofen (Advil, Motrin), or Naprosyn (Aleve) should be avoided, as they can cause increased bleeding.  Most patients feel sinus pressure like they have a bad head cold for several days.  Sleeping with your head elevated can help reduce swelling and facial pressure, as can ice packs over the face.  A humidifier may be helpful to keep the mucous and blood from drying in the nose.   Diet: There are no specific diet restrictions, however, you should generally start with clear liquids and a light diet of bland foods because the anesthetic can cause some nausea.  Advance your diet depending on how your stomach feels.  Taking your pain medication with food will often help reduce stomach upset which pain medications can cause.  Nasal Saline Irrigation: It is important to remove blood clots and dried mucous from the nose as it is healing.  This is done by having you irrigate the nose at least 3 - 4 times daily with a salt water solution.  We recommend using NeilMed Sinus Rinse (available at the drug store).  Fill the squeeze bottle with the solution, bend over a sink, and insert the tip of the squeeze bottle into the nose  of an inch.  Point the tip of the squeeze bottle towards the inside corner of the eye on the same side your irrigating.  Squeeze the bottle and gently irrigate the nose.  If you bend forward as you do this, most of the fluid will flow back out of the nose, instead of down your throat.   The solution should be warm, near body temperature, when you irrigate.   Each time you irrigate, you should use a full squeeze bottle.   Note that if you are instructed to use Nasal Steroid Sprays at any time after your surgery, irrigate with saline BEFORE using the steroid spray, so you do not wash it all out of the nose. Another product, Nasal Saline Gel (such as AYR Nasal Saline Gel) can be applied in each nostril 3 - 4 times daily to moisture the nose and reduce scabbing or crusting.  Bleeding:   Bloody drainage from the nose can be expected for several days, and patients are instructed to irrigate their nose frequently with salt water to help remove mucous and blood clots.  The drainage may be dark red or brown, though some fresh blood may be seen intermittently, especially after irrigation.  Do not blow you nose, as bleeding may occur. If you must sneeze, keep your mouth open to allow air to escape through your mouth.  If heavy bleeding occurs: Irrigate the nose with saline to rinse out clots, then spray the nose 3 - 4 times with Afrin Nasal Decongestant Spray.  The spray will constrict the blood vessels to slow bleeding.  Pinch the lower half of your nose shut to apply pressure, and lay down with your head elevated.  Ice packs over the nose may help as well. If bleeding persists despite these measures, you should notify your doctor.  Do not use the Afrin routinely to control nasal congestion after surgery, as it can result in worsening congestion and may affect healing.     Activity: Return to work varies among patients. Most patients will be out   of work at least 5 - 7 days to recover.  Patient may return to work after they are off of narcotic pain medication, and feeling well enough to perform the functions of their job.  Patients must avoid heavy lifting (over 10 pounds) or strenuous physical for 2 weeks after surgery, so your employer may need to assign you to light duty, or keep you out of work longer if light duty is not possible.  NOTE: you should not drive, operate dangerous machinery, do any mentally demanding tasks or make any important legal or financial decisions while on narcotic pain medication and recovering from the general anesthetic.    Call Your Doctor Immediately if You Have Any of the Following: Bleeding that you cannot control with the above measures Loss of vision, double vision, bulging of the eye or black eyes. Fever over 101 degrees Neck stiffness with severe headache,  fever, nausea and change in mental state. You are always encouraged to call anytime with concerns, however, please call with requests for pain medication refills during office hours.  Office Endoscopy: During follow-up visits your doctor will remove any packing or splints that may have been placed and evaluate and clean your sinuses endoscopically.  Topical anesthetic will be used to make this as comfortable as possible, though you may want to take your pain medication prior to the visit.  How often this will need to be done varies from patient to patient.  After complete recovery from the surgery, you may need follow-up endoscopy from time to time, particularly if there is concern of recurrent infection or nasal polyps.  

## 2022-10-03 ENCOUNTER — Ambulatory Visit: Payer: BC Managed Care – PPO | Admitting: Anesthesiology

## 2022-10-03 ENCOUNTER — Ambulatory Visit
Admission: RE | Admit: 2022-10-03 | Discharge: 2022-10-03 | Disposition: A | Discharge status: Home / Self Care | Payer: BC Managed Care – PPO | Attending: Unknown Physician Specialty | Admitting: Unknown Physician Specialty

## 2022-10-03 ENCOUNTER — Other Ambulatory Visit: Payer: Self-pay

## 2022-10-03 ENCOUNTER — Encounter: Payer: Self-pay | Admitting: Unknown Physician Specialty

## 2022-10-03 ENCOUNTER — Encounter
Admission: RE | Disposition: A | Discharge status: Home / Self Care | Payer: Self-pay | Source: Home / Self Care | Attending: Unknown Physician Specialty

## 2022-10-03 DIAGNOSIS — E669 Obesity, unspecified: Secondary | ICD-10-CM | POA: Insufficient documentation

## 2022-10-03 DIAGNOSIS — J342 Deviated nasal septum: Secondary | ICD-10-CM | POA: Diagnosis not present

## 2022-10-03 DIAGNOSIS — J3489 Other specified disorders of nose and nasal sinuses: Secondary | ICD-10-CM | POA: Diagnosis not present

## 2022-10-03 DIAGNOSIS — J343 Hypertrophy of nasal turbinates: Secondary | ICD-10-CM | POA: Diagnosis not present

## 2022-10-03 DIAGNOSIS — Z6834 Body mass index (BMI) 34.0-34.9, adult: Secondary | ICD-10-CM | POA: Diagnosis not present

## 2022-10-03 HISTORY — PX: SEPTOPLASTY: SHX2393

## 2022-10-03 HISTORY — DX: Headache, unspecified: R51.9

## 2022-10-03 HISTORY — PX: TURBINATE REDUCTION: SHX6157

## 2022-10-03 SURGERY — SEPTOPLASTY, NOSE
Anesthesia: General | Site: Nose | Laterality: Bilateral

## 2022-10-03 MED ORDER — SULFAMETHOXAZOLE-TRIMETHOPRIM 800-160 MG PO TABS: 1.0000 | ORAL_TABLET | Freq: Two times a day (BID) | ORAL | 0 refills | Status: DC

## 2022-10-03 MED ORDER — DROPERIDOL 2.5 MG/ML IJ SOLN: 0.6250 mg | Freq: Once | INTRAMUSCULAR | Status: DC | PRN

## 2022-10-03 MED ORDER — DEXAMETHASONE SODIUM PHOSPHATE 4 MG/ML IJ SOLN
INTRAMUSCULAR | Status: DC | PRN
  Administered 2022-10-03: 8 mg via INTRAVENOUS

## 2022-10-03 MED ORDER — OXYCODONE HCL 5 MG PO TABS: 5.0000 mg | ORAL_TABLET | Freq: Once | ORAL | Status: AC | PRN

## 2022-10-03 MED ORDER — MIDAZOLAM HCL 5 MG/5ML IJ SOLN
INTRAMUSCULAR | Status: DC | PRN
  Administered 2022-10-03: 2 mg via INTRAVENOUS

## 2022-10-03 MED ORDER — LACTATED RINGERS IV SOLN: INTRAVENOUS | Status: DC

## 2022-10-03 MED ORDER — PROPOFOL 10 MG/ML IV BOLUS
INTRAVENOUS | Status: DC | PRN
  Administered 2022-10-03: 200 mg via INTRAVENOUS

## 2022-10-03 MED ORDER — LIDOCAINE HCL (CARDIAC) PF 100 MG/5ML IV SOSY
PREFILLED_SYRINGE | INTRAVENOUS | Status: DC | PRN
  Administered 2022-10-03: 100 mg via INTRAVENOUS

## 2022-10-03 MED ORDER — FENTANYL CITRATE PF 50 MCG/ML IJ SOSY: 25.0000 ug | PREFILLED_SYRINGE | INTRAMUSCULAR | Status: DC | PRN

## 2022-10-03 MED ORDER — PHENYLEPHRINE HCL 0.5 % NA SOLN
NASAL | Status: DC | PRN
  Administered 2022-10-03: 15 mL via TOPICAL

## 2022-10-03 MED ORDER — OXYMETAZOLINE HCL 0.05 % NA SOLN
6.0000 | Freq: Once | NASAL | Status: AC
  Administered 2022-10-03: 6 via NASAL

## 2022-10-03 MED ORDER — ROCURONIUM BROMIDE 100 MG/10ML IV SOLN
INTRAVENOUS | Status: DC | PRN
  Administered 2022-10-03: 40 mg via INTRAVENOUS

## 2022-10-03 MED ORDER — FENTANYL CITRATE (PF) 100 MCG/2ML IJ SOLN
INTRAMUSCULAR | Status: DC | PRN
  Administered 2022-10-03 (×2): 50 ug via INTRAVENOUS

## 2022-10-03 MED ORDER — ACETAMINOPHEN 500 MG PO TABS
1000.0000 mg | ORAL_TABLET | Freq: Once | ORAL | Status: AC
  Administered 2022-10-03: 1000 mg via ORAL

## 2022-10-03 MED ORDER — SUGAMMADEX SODIUM 200 MG/2ML IV SOLN
INTRAVENOUS | Status: DC | PRN
  Administered 2022-10-03: 200 mg via INTRAVENOUS

## 2022-10-03 MED ORDER — ONDANSETRON HCL 4 MG/2ML IJ SOLN
INTRAMUSCULAR | Status: DC | PRN
  Administered 2022-10-03: 4 mg via INTRAVENOUS

## 2022-10-03 MED ORDER — DEXMEDETOMIDINE HCL IN NACL 80 MCG/20ML IV SOLN
INTRAVENOUS | Status: DC | PRN
  Administered 2022-10-03: 10 ug via BUCCAL

## 2022-10-03 MED ORDER — LIDOCAINE-EPINEPHRINE 1 %-1:100000 IJ SOLN
INTRAMUSCULAR | Status: DC | PRN
  Administered 2022-10-03: 2 mg
  Administered 2022-10-03: 10 mL

## 2022-10-03 MED ORDER — PROMETHAZINE HCL 25 MG/ML IJ SOLN: 6.2500 mg | INTRAMUSCULAR | Status: DC | PRN

## 2022-10-03 MED ORDER — ACETAMINOPHEN 10 MG/ML IV SOLN: 1000.0000 mg | Freq: Once | INTRAVENOUS | Status: DC | PRN

## 2022-10-03 MED ORDER — OXYCODONE HCL 5 MG/5ML PO SOLN
5.0000 mg | Freq: Once | ORAL | Status: AC | PRN
  Administered 2022-10-03: 5 mg via ORAL

## 2022-10-03 MED ORDER — HYDROCODONE-ACETAMINOPHEN 5-325 MG PO TABS: 1.0000 | ORAL_TABLET | ORAL | 0 refills | Status: DC | PRN

## 2022-10-03 SURGICAL SUPPLY — 22 items
DRAPE HEAD BAR (DRAPES) ×1 IMPLANT
DRESSING NASL FOAM PST OP SINU (MISCELLANEOUS) IMPLANT
DRSG NASAL FOAM POST OP SINU (MISCELLANEOUS) ×2
ELECT REM PT RETURN 9FT ADLT (ELECTROSURGICAL) ×1
ELECTRODE REM PT RTRN 9FT ADLT (ELECTROSURGICAL) ×1 IMPLANT
GLOVE SURG ENC TEXT LTX SZ7.5 (GLOVE) ×2 IMPLANT
HANDLE YANKAUER SUCT BULB TIP (MISCELLANEOUS) ×1 IMPLANT
KIT TURNOVER KIT A (KITS) ×1 IMPLANT
NDL HYPO 25GX1X1/2 BEV (NEEDLE) ×1 IMPLANT
NEEDLE HYPO 25GX1X1/2 BEV (NEEDLE) ×1 IMPLANT
PACK ENT CUSTOM (PACKS) ×1 IMPLANT
SPLINT NASAL SEPTAL BLV .25 LG (MISCELLANEOUS) IMPLANT
SPONGE NEURO XRAY DETECT 1X3 (DISPOSABLE) ×1 IMPLANT
STRAP BODY AND KNEE 60X3 (MISCELLANEOUS) ×1 IMPLANT
SUCTION COAG ELEC 10 HAND CTRL (ELECTROSURGICAL) IMPLANT
SUT CHROMIC 3-0 (SUTURE) ×2
SUT CHROMIC 3-0 KS 27XMFL CR (SUTURE) ×2
SUT ETHILON 3-0 KS 30 BLK (SUTURE) ×1 IMPLANT
SUTURE CHRMC 3-0 KS 27XMFL CR (SUTURE) ×1 IMPLANT
SYR 10ML LL (SYRINGE) ×1 IMPLANT
TOWEL OR 17X26 4PK STRL BLUE (TOWEL DISPOSABLE) ×1 IMPLANT
WATER STERILE IRR 250ML POUR (IV SOLUTION) ×1 IMPLANT

## 2022-10-03 NOTE — Anesthesia Procedure Notes (Signed)
Procedure Name: Intubation Date/Time: 10/03/2022 12:19 PM  Performed by: Paetyn Pietrzak, [place], CRNAPre-anesthesia Checklist: Patient identified, Patient being monitored, Timeout performed, Emergency Drugs available and Suction available Patient Re-evaluated:Patient Re-evaluated prior to induction Oxygen Delivery Method: Circle system utilized Preoxygenation: Pre-oxygenation with 100% oxygen Induction Type: IV induction Ventilation: Mask ventilation without difficulty Laryngoscope Size: 4 and Mac Grade View: Grade II Tube type: Oral Rae Tube size: 7.5 mm Number of attempts: 1 Airway Equipment and Method: Stylet Placement Confirmation: ETT inserted through vocal cords under direct vision, positive ETCO2 and breath sounds checked- equal and bilateral Secured at: 23 cm Tube secured with: Tape Dental Injury: Teeth and Oropharynx as per pre-operative assessment

## 2022-10-03 NOTE — Anesthesia Postprocedure Evaluation (Signed)
Anesthesia Post Note  Patient: Neil Serrano  Procedure(s) Performed: SEPTOPLASTY (Bilateral: Nose) SUBMUCOSAL RESECTION OF NASAL TURBINATES (Bilateral: Nose)  Patient location during evaluation: PACU Anesthesia Type: General Level of consciousness: awake and alert Pain management: pain level controlled Vital Signs Assessment: post-procedure vital signs reviewed and stable Respiratory status: spontaneous breathing, nonlabored ventilation and respiratory function stable Cardiovascular status: blood pressure returned to baseline and stable Postop Assessment: no apparent nausea or vomiting Anesthetic complications: no   No notable events documented.   Last Vitals:  Vitals:   10/03/22 1430 10/03/22 1435  BP: (!) 151/102 (!) 156/93  Pulse: 90 81  Resp: (!) 22 (!) 23  Temp:    SpO2: 100% 93%    Last Pain:  Vitals:   10/03/22 1420  TempSrc:   PainSc: 3                  [place] Ouch

## 2022-10-03 NOTE — Anesthesia Preprocedure Evaluation (Addendum)
Anesthesia Evaluation  Patient identified by MRN, date of birth, ID band Patient awake    Reviewed: Allergy & Precautions, H&P , NPO status , Patient's Chart, lab work & pertinent test results  Airway Mallampati: II  TM Distance: >3 FB Neck ROM: full    Dental no notable dental hx.    Pulmonary neg pulmonary ROS   Pulmonary exam normal        Cardiovascular negative cardio ROS Normal cardiovascular exam     Neuro/Psych negative neurological ROS  negative psych ROS   GI/Hepatic negative GI ROS, Neg liver ROS,,,  Endo/Other  negative endocrine ROS    Renal/GU      Musculoskeletal   Abdominal  (+) + obese  Peds  Hematology negative hematology ROS (+)   Anesthesia Other Findings Past Surgical History: No date: IMAGE GUIDED SINUS SURGERY No date: WISDOM TOOTH EXTRACTION  BMI    Body Mass Index: 34.24 kg/m      Reproductive/Obstetrics negative OB ROS                             Anesthesia Physical Anesthesia Plan  ASA: 2  Anesthesia Plan: General ETT   Post-op Pain Management: Tylenol PO (pre-op) and Toradol IV (intra-op)   Induction: Intravenous  PONV Risk Score and Plan: 2 and Ondansetron, Dexamethasone and Midazolam  Airway Management Planned: Oral ETT  Additional Equipment:   Intra-op Plan:   Post-operative Plan: Extubation in OR  Informed Consent: I have reviewed the patients History and Physical, chart, labs and discussed the procedure including the risks, benefits and alternatives for the proposed anesthesia with the patient or authorized representative who has indicated his/her understanding and acceptance.       Plan Discussed with: CRNA and Surgeon  Anesthesia Plan Comments:        Anesthesia Quick Evaluation

## 2022-10-03 NOTE — Transfer of Care (Signed)
Immediate Anesthesia Transfer of Care Note  Patient: Neil Serrano  Procedure(s) Performed: SEPTOPLASTY (Bilateral: Nose) SUBMUCOSAL RESECTION OF NASAL TURBINATES (Bilateral: Nose)  Patient Location: PACU  Anesthesia Type: General ETT  Level of Consciousness: awake, alert  and patient cooperative  Airway and Oxygen Therapy: Patient Spontanous Breathing and Patient connected to supplemental oxygen  Post-op Assessment: Post-op Vital signs reviewed, Patient's Cardiovascular Status Stable, Respiratory Function Stable, Patent Airway and No signs of Nausea or vomiting  Post-op Vital Signs: Reviewed and stable  Complications: No notable events documented.

## 2022-10-03 NOTE — H&P (Signed)
The patient's history has been reviewed, patient examined, no change in status, stable for surgery.  Questions were answered to the patients satisfaction.  

## 2022-10-03 NOTE — Op Note (Signed)
PREOPERATIVE DIAGNOSIS:  Chronic nasal obstruction.  POSTOPERATIVE DIAGNOSIS:  Chronic nasal obstruction.  SURGEON:  Roena Malady, M.D.  NAME OF PROCEDURE:  Nasal septoplasty. Submucous resection of inferior turbinates.  OPERATIVE FINDINGS:  Severe nasal septal deformity, hypertrophy of the inferior turbinates.   DESCRIPTION OF THE PROCEDURE:  Neil Serrano was identified in the holding area and taken to the operating room and placed in the supine position.  After general endotracheal anesthesia was induced, the table was turned 45 degrees and the patient was placed in a semi-Fowler position.  The nose was then topically anesthetized with Lidocaine, cotton pledgets were placed within each nostril. After approximately 5 minutes, this was removed at which time a local anesthetic of 1% Lidocaine 1:100,000 units of Epinephrine was used to inject the inferior turbinates in the nasal septum. A total of 12 ml was used. Examination of the nose showed a severe left nasal septal deformity and tremendous hypertrophied inferior turbinate.  Beginning on the right hand side a hemitransfixion incision was then created on the leading edge of the septum on the right.  A subperichondrial plane was elevated posteriorly on the left and taken back to the perpendicular plate of the ethmoid where subperiosteal plane was elevated posteriorly on the left. A large septal spur was identified on the left hand side impacting on the inferior turbinate.  An inferior rim of cartilage was removed anteriorly with care taken to leave an anterior strut to prevent nasal collapse. With this strut removed the perpendicular plate of the ethmoid was separated from the quadrangular cartilage. The large septal spur was removed.  The septum was then replaced in the midline. Reinspection through each nostril showed excellent reduction of the septal deformity. A left posterior inferior fenestration was then created to allow hematoma  drainage.  With the septoplasty completed, beginning on the left-hand side, a 15 blade was used to incise along the inferior edge of the inferior turbinate. A superior laterally based flap was then elevated. The underlying conchal bone of mucosa was excised using Knight scissors. The flap was then laid back over the turbinate stump and cauterized using suction cautery. In a similar fashion the submucous resection was performed on the right.  With the submucous resection completed bilaterally and no active bleeding, the hemitransfixion incision was then closed using two interrupted 3-0 chromic sutures.  Plastic nasal septal splints were placed within each nostril and affixed to the septum using a 3-0 nylon suture. Stammberger was then used beneath each inferior turbinate for hemostasis.    The patient tolerated the procedure well, was returned to anesthesia, extubated in the operating room, and taken to the recovery room in stable condition.    CULTURES:  None.  SPECIMENS:  None.  ESTIMATED BLOOD LOSS:  25 cc.  Roena Malady  10/03/2022  1:20 PM

## 2022-10-06 ENCOUNTER — Encounter: Payer: Self-pay | Admitting: Unknown Physician Specialty

## 2022-10-16 DIAGNOSIS — J019 Acute sinusitis, unspecified: Secondary | ICD-10-CM | POA: Diagnosis not present

## 2022-11-05 DIAGNOSIS — Z48813 Encounter for surgical aftercare following surgery on the respiratory system: Secondary | ICD-10-CM | POA: Diagnosis not present

## 2022-12-11 DIAGNOSIS — J340 Abscess, furuncle and carbuncle of nose: Secondary | ICD-10-CM | POA: Diagnosis not present

## 2023-01-12 ENCOUNTER — Encounter: Payer: BC Managed Care – PPO | Admitting: Family Medicine

## 2023-01-12 ENCOUNTER — Encounter: Payer: Self-pay | Admitting: Family Medicine

## 2023-01-14 NOTE — Progress Notes (Signed)
Erroneous

## 2023-01-15 ENCOUNTER — Encounter: Payer: BC Managed Care – PPO | Admitting: Family Medicine

## 2023-01-16 ENCOUNTER — Ambulatory Visit (INDEPENDENT_AMBULATORY_CARE_PROVIDER_SITE_OTHER): Payer: BC Managed Care – PPO | Admitting: Family Medicine

## 2023-01-16 ENCOUNTER — Encounter: Payer: Self-pay | Admitting: Family Medicine

## 2023-01-16 VITALS — BP 120/80 | HR 85 | Ht 71.0 in | Wt 241.0 lb

## 2023-01-16 DIAGNOSIS — E559 Vitamin D deficiency, unspecified: Secondary | ICD-10-CM

## 2023-01-16 DIAGNOSIS — D165 Benign neoplasm of lower jaw bone: Secondary | ICD-10-CM | POA: Diagnosis not present

## 2023-01-16 DIAGNOSIS — Z0000 Encounter for general adult medical examination without abnormal findings: Secondary | ICD-10-CM

## 2023-01-16 DIAGNOSIS — Z13220 Encounter for screening for lipoid disorders: Secondary | ICD-10-CM

## 2023-01-16 DIAGNOSIS — D164 Benign neoplasm of bones of skull and face: Secondary | ICD-10-CM | POA: Diagnosis not present

## 2023-01-16 DIAGNOSIS — Z9889 Other specified postprocedural states: Secondary | ICD-10-CM | POA: Diagnosis not present

## 2023-01-16 DIAGNOSIS — Z09 Encounter for follow-up examination after completed treatment for conditions other than malignant neoplasm: Secondary | ICD-10-CM | POA: Diagnosis not present

## 2023-01-19 DIAGNOSIS — Z13220 Encounter for screening for lipoid disorders: Secondary | ICD-10-CM | POA: Diagnosis not present

## 2023-01-19 DIAGNOSIS — Z0000 Encounter for general adult medical examination without abnormal findings: Secondary | ICD-10-CM | POA: Diagnosis not present

## 2023-01-20 LAB — COMPREHENSIVE METABOLIC PANEL
ALT: 22 IU/L (ref 0–44)
AST: 20 IU/L (ref 0–40)
Albumin: 4.7 g/dL (ref 4.1–5.1)
Alkaline Phosphatase: 64 IU/L (ref 44–121)
BUN/Creatinine Ratio: 10 (ref 9–20)
BUN: 9 mg/dL (ref 6–20)
Bilirubin Total: 1.2 mg/dL (ref 0.0–1.2)
CO2: 24 mmol/L (ref 20–29)
Calcium: 9.9 mg/dL (ref 8.7–10.2)
Chloride: 99 mmol/L (ref 96–106)
Creatinine, Ser: 0.89 mg/dL (ref 0.76–1.27)
Globulin, Total: 2.9 g/dL (ref 1.5–4.5)
Glucose: 81 mg/dL (ref 70–99)
Potassium: 4.4 mmol/L (ref 3.5–5.2)
Sodium: 138 mmol/L (ref 134–144)
Total Protein: 7.6 g/dL (ref 6.0–8.5)
eGFR: 115 mL/min/{1.73_m2} (ref >59)

## 2023-01-20 LAB — CBC
Hematocrit: 49.4 % (ref 37.5–51.0)
Hemoglobin: 16.3 g/dL (ref 13.0–17.7)
MCH: 30.2 pg (ref 26.6–33.0)
MCHC: 33 g/dL (ref 31.5–35.7)
MCV: 92 fL (ref 79–97)
Platelets: 297 10*3/uL (ref 150–450)
RBC: 5.39 x10E6/uL (ref 4.14–5.80)
RDW: 12.9 % (ref 11.6–15.4)
WBC: 6.2 10*3/uL (ref 3.4–10.8)

## 2023-01-20 LAB — LIPID PANEL
Chol/HDL Ratio: 5.5 ratio — ABNORMAL HIGH (ref 0.0–5.0)
Cholesterol, Total: 213 mg/dL — ABNORMAL HIGH (ref 100–199)
HDL: 39 mg/dL — ABNORMAL LOW (ref >39)
LDL Chol Calc (NIH): 139 mg/dL — ABNORMAL HIGH (ref 0–99)
Triglycerides: 196 mg/dL — ABNORMAL HIGH (ref 0–149)
VLDL Cholesterol Cal: 35 mg/dL (ref 5–40)

## 2023-01-20 LAB — VITAMIN D 25 HYDROXY (VIT D DEFICIENCY, FRACTURES): Vit D, 25-Hydroxy: 32 ng/mL (ref 30.0–100.0)

## 2023-01-20 LAB — TSH: TSH: 1.65 u[IU]/mL (ref 0.450–4.500)

## 2023-01-21 ENCOUNTER — Ambulatory Visit
Admission: RE | Admit: 2023-01-21 | Discharge: 2023-01-21 | Disposition: A | Discharge status: Home / Self Care | Payer: BLUE CROSS/BLUE SHIELD | Source: Ambulatory Visit | Attending: Family Medicine | Admitting: Family Medicine

## 2023-01-21 ENCOUNTER — Encounter: Payer: Self-pay | Admitting: Family Medicine

## 2023-01-21 DIAGNOSIS — Z0000 Encounter for general adult medical examination without abnormal findings: Secondary | ICD-10-CM | POA: Insufficient documentation

## 2023-01-28 ENCOUNTER — Other Ambulatory Visit: Payer: Self-pay | Admitting: Family Medicine

## 2023-01-28 DIAGNOSIS — G4733 Obstructive sleep apnea (adult) (pediatric): Secondary | ICD-10-CM | POA: Diagnosis not present

## 2023-01-28 DIAGNOSIS — E782 Mixed hyperlipidemia: Secondary | ICD-10-CM

## 2023-01-28 DIAGNOSIS — R0683 Snoring: Secondary | ICD-10-CM | POA: Diagnosis not present

## 2023-01-28 DIAGNOSIS — G4719 Other hypersomnia: Secondary | ICD-10-CM | POA: Diagnosis not present

## 2023-01-28 MED ORDER — ATORVASTATIN CALCIUM 20 MG PO TABS: 20.0000 mg | ORAL_TABLET | Freq: Every day | ORAL | 3 refills | Status: DC

## 2023-01-29 ENCOUNTER — Other Ambulatory Visit: Payer: Self-pay

## 2023-01-29 DIAGNOSIS — E782 Mixed hyperlipidemia: Secondary | ICD-10-CM

## 2023-01-29 NOTE — Progress Notes (Signed)
Labs ordered.  KP

## 2023-01-30 DIAGNOSIS — Z0000 Encounter for general adult medical examination without abnormal findings: Secondary | ICD-10-CM | POA: Insufficient documentation

## 2023-01-30 NOTE — Patient Instructions (Signed)
-   Obtain fasting labs with orders provided (can have water or black coffee but otherwise no food or drink x 8 hours before labs) °- Review information provided °- Attend eye doctor annually, dentist every 6 months, work towards or maintain 30 minutes of moderate intensity physical activity at least 5 days per week, and consume a balanced diet °- Return in 1 year for physical °- Contact us for any questions between now and then °

## 2023-01-30 NOTE — Progress Notes (Signed)
Annual Physical Exam Visit  Patient Information:  Patient ID: Neil Serrano, male DOB: 1985/08/17 Age: 37 y.o. MRN: [id number]   Subjective:   CC: Annual Physical Exam  HPI:  Neil Serrano is here for their annual physical.  I reviewed the past medical history, family history, social history, surgical history, and allergies today and changes were made as necessary.  Please see the problem list section below for additional details.  Past Medical History: Past Medical History:  Diagnosis Date   Asthma    as a child   Headache    sinus related   Past Surgical History: Past Surgical History:  Procedure Laterality Date   IMAGE GUIDED SINUS SURGERY     SEPTOPLASTY Bilateral 10/03/2022   Procedure: SEPTOPLASTY;  Surgeon: Linus Salmons, MD;  Location: [facility];  Service: ENT;  Laterality: Bilateral;   TURBINATE REDUCTION Bilateral 10/03/2022   Procedure: SUBMUCOSAL RESECTION OF NASAL TURBINATES;  Surgeon: Linus Salmons, MD;  Location: [facility];  Service: ENT;  Laterality: Bilateral;   WISDOM TOOTH EXTRACTION     Family History: Family History  Problem Relation Age of Onset   Healthy Mother    Heart disease Father    Allergies: No Known Allergies Health Maintenance: Health Maintenance  Topic Date Due   COVID-19 Vaccine (1 - 2023-24 season) 06/25/2026 (Originally 03/21/2022)   INFLUENZA VACCINE  02/19/2023   DTaP/Tdap/Td (2 - Td or Tdap) 01/15/2032   Hepatitis C Screening  Completed   HIV Screening  Completed   HPV VACCINES  Aged Out    HM Colonoscopy     This patient has no relevant Health Maintenance data.      Medications: Current Outpatient Medications on File Prior to Visit  Medication Sig Dispense Refill   cetirizine (ZYRTEC) 10 MG chewable tablet Chew 10 mg by mouth daily.     No current facility-administered medications on file prior to visit.    Review of Systems: No headache, visual changes, nausea, vomiting, diarrhea,  constipation, dizziness, abdominal pain, skin rash, fevers, chills, night sweats, swollen lymph nodes, weight loss, chest pain, body aches, joint swelling, muscle aches, shortness of breath, mood changes, visual or auditory hallucinations reported.  Objective:   Vitals:   01/16/23 1529  BP: 120/80  Pulse: 85  SpO2: 98%   Vitals:   01/16/23 1529  Weight: 241 lb (109.3 kg)  Height: 5\' 11"  (1.803 m)   Body mass index is 33.61 kg/m.  General: Well Developed, well nourished, and in no acute distress.  Neuro: Alert and oriented x3, extra-ocular muscles intact, sensation grossly intact. Cranial nerves II through XII are grossly intact, motor, sensory, and coordinative functions are intact. HEENT: Normocephalic, atraumatic, pupils equal round reactive to light, neck supple, no masses, no lymphadenopathy, thyroid nonpalpable. Oropharynx, nasopharynx, external ear canals are unremarkable. Skin: Warm and dry, no rashes noted.  Cardiac: Regular rate and rhythm, no murmurs rubs or gallops. No peripheral edema. Pulses symmetric. Respiratory: Clear to auscultation bilaterally. Not using accessory muscles, speaking in full sentences.  Abdominal: Soft, nontender, nondistended, positive bowel sounds, no masses, no organomegaly. Musculoskeletal: Shoulder, elbow, wrist, hip, knee, ankle stable, and with full range of motion.    Impression and Recommendations:   The patient was counselled, risk factors were discussed, and anticipatory guidance given.  Problem List Items Addressed This Visit       Other   Healthcare maintenance - Primary    Annual examination completed, risk stratification labs ordered, anticipatory  guidance provided.  We will follow labs once resulted.      Relevant Orders   Comprehensive metabolic panel (Completed)   CBC (Completed)   Lipid panel (Completed)   TSH (Completed)   VITAMIN D 25 Hydroxy (Vit-D Deficiency, Fractures) (Completed)   CT CARDIAC SCORING (Completed)    Annual physical exam   Relevant Orders   Comprehensive metabolic panel (Completed)   CBC (Completed)   Lipid panel (Completed)   TSH (Completed)   VITAMIN D 25 Hydroxy (Vit-D Deficiency, Fractures) (Completed)   CT CARDIAC SCORING (Completed)   Other Visit Diagnoses     Vitamin D deficiency       Screening for lipoid disorders       Relevant Orders   Comprehensive metabolic panel (Completed)   Lipid panel (Completed)        Orders & Medications Medications: No orders of the defined types were placed in this encounter.  Orders Placed This Encounter  Procedures   CT CARDIAC SCORING   Comprehensive metabolic panel   CBC   Lipid panel   TSH   VITAMIN D 25 Hydroxy (Vit-D Deficiency, Fractures)     No follow-ups on file.    Jerrol Banana, MD, [facility]   Primary Care Sports Medicine Primary Care and Sports Medicine at [facility]

## 2023-01-30 NOTE — Assessment & Plan Note (Signed)
Annual examination completed, risk stratification labs ordered, anticipatory guidance provided.  We will follow labs once resulted. 

## 2023-02-10 DIAGNOSIS — G4733 Obstructive sleep apnea (adult) (pediatric): Secondary | ICD-10-CM | POA: Diagnosis not present

## 2023-06-12 DIAGNOSIS — J019 Acute sinusitis, unspecified: Secondary | ICD-10-CM | POA: Diagnosis not present

## 2023-10-29 ENCOUNTER — Encounter: Payer: Self-pay | Admitting: Family Medicine

## 2023-10-30 NOTE — Telephone Encounter (Signed)
 Please review.  KP

## 2023-11-10 ENCOUNTER — Other Ambulatory Visit: Payer: Self-pay

## 2023-11-10 DIAGNOSIS — Z0000 Encounter for general adult medical examination without abnormal findings: Secondary | ICD-10-CM

## 2024-01-10 ENCOUNTER — Other Ambulatory Visit: Payer: Self-pay | Admitting: Family Medicine

## 2024-01-10 DIAGNOSIS — E782 Mixed hyperlipidemia: Secondary | ICD-10-CM

## 2024-01-12 NOTE — Telephone Encounter (Signed)
 Requested by interface surescripts. Future visit in 2 weeks. Last OV 01/16/23.  Requested Prescriptions  Pending Prescriptions Disp Refills   atorvastatin  (LIPITOR) 20 MG tablet [Pharmacy Med Name: ATORVASTATIN  20MG  TABLETS] 90 tablet 0    Sig: TAKE 1 TABLET(20 MG) BY MOUTH DAILY     Cardiovascular:  Antilipid - Statins Failed - 01/12/2024  1:38 PM      Failed - Valid encounter within last 12 months    Recent Outpatient Visits   None     Future Appointments             In 2 weeks Alvia Selinda PARAS, MD [facility] Primary Care & Sports Medicine at [facility]            Failed - Lipid Panel in normal range within the last 12 months    Cholesterol, Total  Date Value Ref Range Status  01/19/2023 213 (H) 100 - 199 mg/dL Final   LDL Chol Calc (NIH)  Date Value Ref Range Status  01/19/2023 139 (H) 0 - 99 mg/dL Final   HDL  Date Value Ref Range Status  01/19/2023 39 (L) >39 mg/dL Final   Triglycerides  Date Value Ref Range Status  01/19/2023 196 (H) 0 - 149 mg/dL Final         Passed - Patient is not pregnant

## 2024-01-27 ENCOUNTER — Encounter: Payer: Self-pay | Admitting: Family Medicine

## 2024-01-27 ENCOUNTER — Other Ambulatory Visit: Payer: Self-pay

## 2024-01-27 ENCOUNTER — Ambulatory Visit (INDEPENDENT_AMBULATORY_CARE_PROVIDER_SITE_OTHER): Payer: Self-pay | Admitting: Family Medicine

## 2024-01-27 VITALS — BP 108/82 | HR 67 | Ht 71.0 in | Wt 240.0 lb

## 2024-01-27 DIAGNOSIS — R7309 Other abnormal glucose: Secondary | ICD-10-CM

## 2024-01-27 DIAGNOSIS — E661 Drug-induced obesity: Secondary | ICD-10-CM | POA: Insufficient documentation

## 2024-01-27 DIAGNOSIS — Z6833 Body mass index (BMI) 33.0-33.9, adult: Secondary | ICD-10-CM

## 2024-01-27 DIAGNOSIS — M7712 Lateral epicondylitis, left elbow: Secondary | ICD-10-CM | POA: Insufficient documentation

## 2024-01-27 DIAGNOSIS — E66811 Obesity, class 1: Secondary | ICD-10-CM

## 2024-01-27 DIAGNOSIS — L720 Epidermal cyst: Secondary | ICD-10-CM

## 2024-01-27 DIAGNOSIS — L309 Dermatitis, unspecified: Secondary | ICD-10-CM | POA: Diagnosis not present

## 2024-01-27 DIAGNOSIS — Z0000 Encounter for general adult medical examination without abnormal findings: Secondary | ICD-10-CM | POA: Diagnosis not present

## 2024-01-27 DIAGNOSIS — E782 Mixed hyperlipidemia: Secondary | ICD-10-CM | POA: Insufficient documentation

## 2024-01-27 MED ORDER — CLOTRIMAZOLE-BETAMETHASONE 1-0.05 % EX CREA: 1.0000 | TOPICAL_CREAM | Freq: Two times a day (BID) | CUTANEOUS | 2 refills | Status: AC

## 2024-01-27 NOTE — Progress Notes (Signed)
 Annual Physical Exam Visit  Patient Information:  Patient ID: Neil Serrano, male DOB: 01/18/1986 Age: 38 y.o. MRN: [id number]   Subjective:   CC: Annual Physical Exam  HPI:  Neil Serrano is here for their annual physical.  I reviewed the past medical history, family history, social history, surgical history, and allergies today and changes were made as necessary.  Please see the problem list section below for additional details.  Past Medical History: Past Medical History:  Diagnosis Date   Asthma    as a child   Headache    sinus related   Past Surgical History: Past Surgical History:  Procedure Laterality Date   IMAGE GUIDED SINUS SURGERY     SEPTOPLASTY Bilateral 10/03/2022   Procedure: SEPTOPLASTY;  Surgeon: Herminio Miu, MD;  Location: [facility];  Service: ENT;  Laterality: Bilateral;   TURBINATE REDUCTION Bilateral 10/03/2022   Procedure: SUBMUCOSAL RESECTION OF NASAL TURBINATES;  Surgeon: Herminio Miu, MD;  Location: [facility];  Service: ENT;  Laterality: Bilateral;   WISDOM TOOTH EXTRACTION     Family History: Family History  Problem Relation Age of Onset   Healthy Mother    Heart disease Father    Allergies: No Known Allergies Health Maintenance: Health Maintenance  Topic Date Due   COVID-19 Vaccine (1 - 2024-25 season) 02/12/2024 (Originally 03/22/2023)   Hepatitis B Vaccines (1 of 3 - 19+ 3-dose series) 01/26/2025 (Originally 09/03/2006)   HPV VACCINES (1 - 3-dose SCDM series) 01/26/2025 (Originally 09/03/2014)   INFLUENZA VACCINE  02/19/2024   DTaP/Tdap/Td (2 - Td or Tdap) 01/15/2032   Hepatitis C Screening  Completed   HIV Screening  Completed   Meningococcal B Vaccine  Aged Out    HM Colonoscopy   This patient has no relevant Health Maintenance data.    Medications: Current Outpatient Medications on File Prior to Visit  Medication Sig Dispense Refill   atorvastatin  (LIPITOR) 20 MG tablet TAKE 1 TABLET(20 MG)  BY MOUTH DAILY 90 tablet 0   cetirizine (ZYRTEC) 10 MG chewable tablet Chew 10 mg by mouth daily.     No current facility-administered medications on file prior to visit.    Objective:   Vitals:   01/27/24 0811  BP: 108/82  Pulse: 67  SpO2: 98%   Vitals:   01/27/24 0811  Weight: 240 lb (108.9 kg)  Height: 5' 11 (1.803 m)   Body mass index is 33.47 kg/m.  General: Well Developed, well nourished, and in no acute distress.  Neuro: Alert and oriented x3, extra-ocular muscles intact, sensation grossly intact. Cranial nerves II through XII are grossly intact, motor, sensory, and coordinative functions are intact. HEENT: Normocephalic, atraumatic, neck supple, no masses, no lymphadenopathy, thyroid  nonenlarged. Oropharynx, nasopharynx, external ear canals are unremarkable. Skin: Warm and dry, no rashes noted.  Cardiac: Regular rate and rhythm, no murmurs rubs or gallops. No peripheral edema. Pulses symmetric. Respiratory: Clear to auscultation bilaterally. Speaking in full sentences.  Abdominal: Soft, nontender, nondistended, positive bowel sounds, no masses, no organomegaly. Musculoskeletal: Stable, and with full range of motion. 5/5 strength in upper extremities. Left elbow lateral epicondylitis.   Impression and Recommendations:   The patient was counselled, risk factors were discussed, and anticipatory guidance given.  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 Encouraged dietary modifications and gradual lifestyle changes. - Encourage gradual dietary modifications to reduce weight. - Discussed the importance of sustainable lifestyle changes.      Foot dermatitis   Foot rash - Persistent rash on foot since summer 2023 - Flares intermittently, especially when wearing work boots - Mild pruritus, not  significantly bothersome - Managed with Lotrisone  cream (steroid and antifungal); keeps one tube at work and one at home, currently running low on supply - Previous course of oral prednisone was effective - No current rash under armpits; prior axillary rash resolved with Benadryl cream  Dermatitis Chronic rash on the foot, likely atopic dermatitis type, responds well to topical steroids. Exacerbated by sweat and friction from work boots. - Prescribe Lotrisone  cream with refills for flare-ups. - Instruct to send a picture via MyChart if rash worsens or if Lotrisone  becomes ineffective. - Consider referral to dermatologist or revisit rheumatologist if condition does not improve with current treatment.      Relevant Medications   clotrimazole -betamethasone  (LOTRISONE ) cream   Healthcare maintenance - Primary   Annual examination completed, risk stratification labs ordered, anticipatory guidance provided.  We will follow labs once resulted.      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 - Recommend counterforce brace for temporary relief, use as-needed to minimize symptoms during activity. - Provide exercises focusing on eccentric stretching and strengthening of the forearm muscles. (https://orthoinfo.aaos.org/en/recovery/epicondylitis-therapeutic-exercise-program/) - Advise use of ibuprofen  for pain management as needed. - If persistent symptoms noted, contact our office for next steps      Mixed hyperlipidemia   Hyperlipidemia Chronic hyperlipidemia, likely familial. Managed  with medication. Dietary modifications discussed. - Continue current cholesterol medication. - Review lab results to assess cholesterol levels. - Consider medication adjustment based on lab results.      Relevant Orders   Comprehensive metabolic panel with GFR   Lipid panel   Other Visit Diagnoses       Abnormal glucose       Relevant Orders   Comprehensive metabolic panel with GFR   Hemoglobin A1c        Orders & Medications Medications:  Meds ordered this encounter  Medications   clotrimazole -betamethasone  (LOTRISONE ) cream    Sig: Apply 1 Application topically 2 (two) times daily. Do not exceed 4 weeks of continuous application    Dispense:  45 g    Refill:  2   Orders Placed This Encounter  Procedures   CBC   Comprehensive metabolic panel with GFR   Hemoglobin A1c   Lipid panel   Measles/Mumps/Rubella Immunity     Return in about 1 year (around 01/26/2025) for CPE.    Selinda JINNY Ku, MD, [facility]   Primary Care Sports Medicine Primary Care and Sports Medicine at [facility]

## 2024-01-27 NOTE — Assessment & Plan Note (Signed)
 Hyperlipidemia Chronic hyperlipidemia, likely familial. Managed with medication. Dietary modifications discussed. - Continue current cholesterol medication. - Review lab results to assess cholesterol levels. - Consider medication adjustment based on lab results.

## 2024-01-27 NOTE — Assessment & Plan Note (Signed)
 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 - Recommend counterforce brace for temporary relief, use as-needed to minimize symptoms during activity. - Provide exercises focusing on eccentric stretching and strengthening of the forearm muscles. (https://orthoinfo.aaos.org/en/recovery/epicondylitis-therapeutic-exercise-program/) - Advise use of ibuprofen  for pain management as needed. - If persistent symptoms noted, contact our office for next steps

## 2024-01-27 NOTE — Assessment & Plan Note (Signed)
 Obesity Obesity with a BMI of Body mass index is 33.47 kg/m. Encouraged dietary modifications and gradual lifestyle changes. - Encourage gradual dietary modifications to reduce weight. - Discussed the importance of sustainable lifestyle changes.

## 2024-01-27 NOTE — Assessment & Plan Note (Addendum)
 Foot rash - Persistent rash on foot since summer 2023 - Flares intermittently, especially when wearing work boots - Mild pruritus, not significantly bothersome - Managed with Lotrisone  cream (steroid and antifungal); keeps one tube at work and one at home, currently running low on supply - Previous course of oral prednisone was effective - No current rash under armpits; prior axillary rash resolved with Benadryl cream  Dermatitis Chronic rash on the foot, likely atopic dermatitis type, responds well to topical steroids. Exacerbated by sweat and friction from work boots. - Prescribe Lotrisone  cream with refills for flare-ups. - Instruct to send a picture via MyChart if rash worsens or if Lotrisone  becomes ineffective. - Consider referral to dermatologist or revisit rheumatologist if condition does not improve with current treatment.

## 2024-01-27 NOTE — Assessment & Plan Note (Signed)
 Annual examination completed, risk stratification labs ordered, anticipatory guidance provided.  We will follow labs once resulted.

## 2024-01-27 NOTE — Patient Instructions (Signed)
-   Obtain fasting labs with orders provided (can have water or black coffee but otherwise no food or drink x 8 hours before labs) - Review information provided - Attend eye doctor annually, dentist every 6 months, work towards or maintain 30 minutes of moderate intensity physical activity at least 5 days per week, and consume a balanced diet - Return in 1 year for physical - Contact us  for any questions between now and then   Patient Action Plan  1. Obesity Management:    - Encourage gradual dietary modifications to reduce weight.    - Emphasize the importance of sustainable lifestyle changes.  2. Foot Dermatitis:    - Use Lotrisone  cream for flare-ups; refills prescribed.    - Send a picture via MyChart if the rash worsens or if Lotrisone  becomes ineffective.    - Consider referral to a dermatologist or revisit a rheumatologist if the condition does not improve.  3. Lateral Epicondylitis (Left Elbow Discomfort):    - Use a counterforce brace as needed for symptom relief during activities.    - Perform exercises focusing on eccentric stretching and strengthening of the forearm muscles. (https://orthoinfo.aaos.org/en/recovery/epicondylitis-therapeutic-exercise-program/)    - Take ibuprofen  for pain management as needed.    - Contact the office if symptoms persist.  4. Hyperlipidemia Management:    - Continue current cholesterol medication.    - Review lab results to assess cholesterol levels.    - Consider medication adjustment based on lab results.  Red Flags: - If you experience worsening symptoms or severe side effects from medications, contact the clinic immediately.

## 2024-01-28 ENCOUNTER — Ambulatory Visit: Payer: Self-pay | Admitting: Family Medicine

## 2024-01-28 LAB — COMPREHENSIVE METABOLIC PANEL WITH GFR
ALT: 25 IU/L (ref 0–44)
AST: 26 IU/L (ref 0–40)
Albumin: 4.3 g/dL (ref 4.1–5.1)
Alkaline Phosphatase: 67 IU/L (ref 44–121)
BUN/Creatinine Ratio: 11 (ref 9–20)
BUN: 10 mg/dL (ref 6–20)
Bilirubin Total: 1.1 mg/dL (ref 0.0–1.2)
CO2: 21 mmol/L (ref 20–29)
Calcium: 9.5 mg/dL (ref 8.7–10.2)
Chloride: 103 mmol/L (ref 96–106)
Creatinine, Ser: 0.94 mg/dL (ref 0.76–1.27)
Globulin, Total: 3 g/dL (ref 1.5–4.5)
Glucose: 85 mg/dL (ref 70–99)
Potassium: 4.6 mmol/L (ref 3.5–5.2)
Sodium: 140 mmol/L (ref 134–144)
Total Protein: 7.3 g/dL (ref 6.0–8.5)
eGFR: 108 mL/min/1.73 (ref >59)

## 2024-01-28 LAB — LIPID PANEL
Chol/HDL Ratio: 2.7 ratio (ref 0.0–5.0)
Cholesterol, Total: 131 mg/dL (ref 100–199)
HDL: 48 mg/dL (ref >39)
LDL Chol Calc (NIH): 63 mg/dL (ref 0–99)
Triglycerides: 110 mg/dL (ref 0–149)
VLDL Cholesterol Cal: 20 mg/dL (ref 5–40)

## 2024-01-28 LAB — CBC
Hematocrit: 45.9 % (ref 37.5–51.0)
Hemoglobin: 15.2 g/dL (ref 13.0–17.7)
MCH: 30.6 pg (ref 26.6–33.0)
MCHC: 33.1 g/dL (ref 31.5–35.7)
MCV: 92 fL (ref 79–97)
Platelets: 295 x10E3/uL (ref 150–450)
RBC: 4.97 x10E6/uL (ref 4.14–5.80)
RDW: 12.6 % (ref 11.6–15.4)
WBC: 5.8 x10E3/uL (ref 3.4–10.8)

## 2024-01-28 LAB — HEMOGLOBIN A1C
Est. average glucose Bld gHb Est-mCnc: 111 mg/dL
Hgb A1c MFr Bld: 5.5 % (ref 4.8–5.6)

## 2024-01-28 LAB — MEASLES/MUMPS/RUBELLA IMMUNITY
MUMPS ABS, IGG: <9 [AU]/ml — ABNORMAL LOW (ref >10.9)
RUBEOLA AB, IGG: >300 [AU]/ml (ref >16.4)
Rubella Antibodies, IGG: 2.86 {index} (ref >0.99)

## 2024-01-29 DIAGNOSIS — D165 Benign neoplasm of lower jaw bone: Secondary | ICD-10-CM | POA: Diagnosis not present

## 2024-04-09 ENCOUNTER — Other Ambulatory Visit: Payer: Self-pay | Admitting: Family Medicine

## 2024-04-09 DIAGNOSIS — E782 Mixed hyperlipidemia: Secondary | ICD-10-CM

## 2024-04-11 NOTE — Telephone Encounter (Signed)
 Requested Prescriptions  Pending Prescriptions Disp Refills   atorvastatin  (LIPITOR) 20 MG tablet [Pharmacy Med Name: ATORVASTATIN  20MG  TABLETS] 90 tablet 2    Sig: TAKE 1 TABLET(20 MG) BY MOUTH DAILY     Cardiovascular:  Antilipid - Statins Failed - 04/11/2024 11:41 AM      Failed - Lipid Panel in normal range within the last 12 months    Cholesterol, Total  Date Value Ref Range Status  01/27/2024 131 100 - 199 mg/dL Final   LDL Chol Calc (NIH)  Date Value Ref Range Status  01/27/2024 63 0 - 99 mg/dL Final   HDL  Date Value Ref Range Status  01/27/2024 48 >39 mg/dL Final   Triglycerides  Date Value Ref Range Status  01/27/2024 110 0 - 149 mg/dL Final         Passed - Patient is not pregnant      Passed - Valid encounter within last 12 months    Recent Outpatient Visits           2 months ago Healthcare maintenance   [facility] Primary Care & Sports Medicine at [facility], Selinda PARAS, MD       Future Appointments             In 9 months Alvia, Selinda PARAS, MD [facility] Primary Care & Sports Medicine at [facility], [phone] [place]

## 2024-04-12 DIAGNOSIS — F4322 Adjustment disorder with anxiety: Secondary | ICD-10-CM | POA: Diagnosis not present

## 2024-04-19 DIAGNOSIS — F4322 Adjustment disorder with anxiety: Secondary | ICD-10-CM | POA: Diagnosis not present

## 2024-05-10 DIAGNOSIS — F4322 Adjustment disorder with anxiety: Secondary | ICD-10-CM | POA: Diagnosis not present

## 2024-05-24 DIAGNOSIS — F4322 Adjustment disorder with anxiety: Secondary | ICD-10-CM | POA: Diagnosis not present

## 2024-06-02 DIAGNOSIS — J018 Other acute sinusitis: Secondary | ICD-10-CM | POA: Diagnosis not present

## 2024-06-08 DIAGNOSIS — F4322 Adjustment disorder with anxiety: Secondary | ICD-10-CM | POA: Diagnosis not present

## 2024-06-22 DIAGNOSIS — F4322 Adjustment disorder with anxiety: Secondary | ICD-10-CM | POA: Diagnosis not present

## 2024-07-05 DIAGNOSIS — F4322 Adjustment disorder with anxiety: Secondary | ICD-10-CM | POA: Diagnosis not present

## 2025-01-27 ENCOUNTER — Encounter: Admitting: Family Medicine
